# Patient Record
Sex: Male | Born: 1970 | Race: White | Hispanic: Yes | Marital: Married | Smoking: Never smoker
Health system: Southern US, Community
[De-identification: ages and names within clinical notes are randomized; demographics above are authoritative.]

## PROBLEM LIST (undated history)

## (undated) DIAGNOSIS — I10 Essential (primary) hypertension: Secondary | ICD-10-CM

## (undated) DIAGNOSIS — Z9889 Other specified postprocedural states: Secondary | ICD-10-CM

## (undated) DIAGNOSIS — D649 Anemia, unspecified: Secondary | ICD-10-CM

## (undated) DIAGNOSIS — F32A Depression, unspecified: Secondary | ICD-10-CM

## (undated) DIAGNOSIS — Z9689 Presence of other specified functional implants: Secondary | ICD-10-CM

## (undated) DIAGNOSIS — R7303 Prediabetes: Secondary | ICD-10-CM

## (undated) DIAGNOSIS — F419 Anxiety disorder, unspecified: Secondary | ICD-10-CM

## (undated) DIAGNOSIS — F329 Major depressive disorder, single episode, unspecified: Secondary | ICD-10-CM

## (undated) DIAGNOSIS — Z87898 Personal history of other specified conditions: Secondary | ICD-10-CM

## (undated) DIAGNOSIS — R569 Unspecified convulsions: Secondary | ICD-10-CM

## (undated) HISTORY — DX: Other specified postprocedural states: Z98.890

## (undated) HISTORY — DX: Presence of other specified functional implants: Z96.89

## (undated) HISTORY — PX: OTHER SURGICAL HISTORY: SHX169

## (undated) HISTORY — PX: BACK SURGERY: SHX140

## (undated) HISTORY — PX: SPINAL CORD STIMULATOR INSERTION: SHX5378

## (undated) HISTORY — DX: Personal history of other specified conditions: Z87.898

---
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
## 2008-02-18 DIAGNOSIS — F419 Anxiety disorder, unspecified: Secondary | ICD-10-CM | POA: Insufficient documentation

## 2008-03-02 DIAGNOSIS — F410 Panic disorder [episodic paroxysmal anxiety] without agoraphobia: Secondary | ICD-10-CM | POA: Insufficient documentation

## 2009-05-26 DIAGNOSIS — R519 Headache, unspecified: Secondary | ICD-10-CM | POA: Insufficient documentation

## 2013-12-07 DIAGNOSIS — Z981 Arthrodesis status: Secondary | ICD-10-CM | POA: Insufficient documentation

## 2013-12-30 DIAGNOSIS — E7800 Pure hypercholesterolemia, unspecified: Secondary | ICD-10-CM | POA: Insufficient documentation

## 2014-01-24 DIAGNOSIS — M549 Dorsalgia, unspecified: Secondary | ICD-10-CM | POA: Insufficient documentation

## 2014-02-04 DIAGNOSIS — M960 Pseudarthrosis after fusion or arthrodesis: Secondary | ICD-10-CM | POA: Insufficient documentation

## 2014-02-04 DIAGNOSIS — M25571 Pain in right ankle and joints of right foot: Secondary | ICD-10-CM | POA: Insufficient documentation

## 2014-12-01 DIAGNOSIS — G40A19 Absence epileptic syndrome, intractable, without status epilepticus: Secondary | ICD-10-CM | POA: Insufficient documentation

## 2015-09-19 DIAGNOSIS — M47812 Spondylosis without myelopathy or radiculopathy, cervical region: Secondary | ICD-10-CM | POA: Insufficient documentation

## 2015-09-19 DIAGNOSIS — M5412 Radiculopathy, cervical region: Secondary | ICD-10-CM | POA: Insufficient documentation

## 2015-09-19 DIAGNOSIS — M5416 Radiculopathy, lumbar region: Secondary | ICD-10-CM | POA: Insufficient documentation

## 2017-01-30 HISTORY — PX: FOOT SURGERY: SHX648

## 2018-01-30 HISTORY — PX: SHOULDER SURGERY: SHX246

## 2018-02-27 DIAGNOSIS — M25519 Pain in unspecified shoulder: Secondary | ICD-10-CM | POA: Insufficient documentation

## 2018-04-23 DIAGNOSIS — M7581 Other shoulder lesions, right shoulder: Secondary | ICD-10-CM | POA: Insufficient documentation

## 2018-10-11 ENCOUNTER — Ambulatory Visit (INDEPENDENT_AMBULATORY_CARE_PROVIDER_SITE_OTHER): Payer: Medicare Other | Admitting: Psychiatry

## 2018-10-11 ENCOUNTER — Encounter (HOSPITAL_COMMUNITY): Payer: Self-pay | Admitting: Psychiatry

## 2018-10-11 ENCOUNTER — Other Ambulatory Visit: Payer: Self-pay

## 2018-10-11 VITALS — Wt 180.0 lb

## 2018-10-11 DIAGNOSIS — F411 Generalized anxiety disorder: Secondary | ICD-10-CM

## 2018-10-11 DIAGNOSIS — F410 Panic disorder [episodic paroxysmal anxiety] without agoraphobia: Secondary | ICD-10-CM | POA: Diagnosis not present

## 2018-10-11 DIAGNOSIS — F319 Bipolar disorder, unspecified: Secondary | ICD-10-CM

## 2018-10-11 MED ORDER — VENLAFAXINE HCL 75 MG PO TABS: 75.0000 mg | ORAL_TABLET | ORAL | 1 refills | Status: DC

## 2018-10-11 MED ORDER — ESCITALOPRAM OXALATE 20 MG PO TABS: 20.0000 mg | ORAL_TABLET | Freq: Every day | ORAL | 1 refills | Status: DC

## 2018-10-11 MED ORDER — CLONAZEPAM 0.5 MG PO TABS: 0.5000 mg | ORAL_TABLET | Freq: Every day | ORAL | 1 refills | Status: DC | PRN

## 2018-10-11 MED ORDER — HYDROXYZINE PAMOATE 25 MG PO CAPS: 25.0000 mg | ORAL_CAPSULE | Freq: Four times a day (QID) | ORAL | 1 refills | Status: DC | PRN

## 2018-10-11 MED ORDER — QUETIAPINE FUMARATE 300 MG PO TABS: 300.0000 mg | ORAL_TABLET | Freq: Every day | ORAL | 1 refills | Status: DC
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
## 2018-10-17 ENCOUNTER — Ambulatory Visit: Payer: Medicare Other | Admitting: Podiatry

## 2018-10-29 ENCOUNTER — Other Ambulatory Visit: Payer: Self-pay

## 2018-10-29 ENCOUNTER — Ambulatory Visit (INDEPENDENT_AMBULATORY_CARE_PROVIDER_SITE_OTHER): Payer: Medicare Other

## 2018-10-29 ENCOUNTER — Ambulatory Visit (INDEPENDENT_AMBULATORY_CARE_PROVIDER_SITE_OTHER): Payer: Medicare Other | Admitting: Sports Medicine

## 2018-10-29 DIAGNOSIS — M79671 Pain in right foot: Secondary | ICD-10-CM

## 2018-10-29 DIAGNOSIS — M792 Neuralgia and neuritis, unspecified: Secondary | ICD-10-CM | POA: Diagnosis not present

## 2018-10-29 DIAGNOSIS — G9782 Other postprocedural complications and disorders of nervous system: Secondary | ICD-10-CM

## 2018-10-29 DIAGNOSIS — R200 Anesthesia of skin: Secondary | ICD-10-CM

## 2018-10-29 DIAGNOSIS — G90521 Complex regional pain syndrome I of right lower limb: Secondary | ICD-10-CM
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
## 2018-10-30 ENCOUNTER — Encounter (HOSPITAL_COMMUNITY): Payer: Self-pay | Admitting: Emergency Medicine

## 2018-10-30 ENCOUNTER — Other Ambulatory Visit: Payer: Self-pay

## 2018-10-30 ENCOUNTER — Encounter: Payer: Self-pay | Admitting: Sports Medicine

## 2018-10-30 ENCOUNTER — Telehealth: Payer: Self-pay | Admitting: *Deleted

## 2018-10-30 ENCOUNTER — Emergency Department (HOSPITAL_COMMUNITY)
Admission: EM | Admit: 2018-10-30 | Discharge: 2018-10-30 | Disposition: A | Discharge status: Home / Self Care | Payer: Medicare Other | Attending: Emergency Medicine | Admitting: Emergency Medicine

## 2018-10-30 DIAGNOSIS — M545 Low back pain: Secondary | ICD-10-CM | POA: Insufficient documentation

## 2018-10-30 DIAGNOSIS — M792 Neuralgia and neuritis, unspecified: Secondary | ICD-10-CM

## 2018-10-30 DIAGNOSIS — G8929 Other chronic pain: Secondary | ICD-10-CM | POA: Diagnosis not present

## 2018-10-30 DIAGNOSIS — I10 Essential (primary) hypertension: Secondary | ICD-10-CM | POA: Insufficient documentation

## 2018-10-30 DIAGNOSIS — Z79899 Other long term (current) drug therapy: Secondary | ICD-10-CM | POA: Diagnosis not present

## 2018-10-30 DIAGNOSIS — R200 Anesthesia of skin: Secondary | ICD-10-CM

## 2018-10-30 DIAGNOSIS — G90521 Complex regional pain syndrome I of right lower limb: Secondary | ICD-10-CM

## 2018-10-30 DIAGNOSIS — G9782 Other postprocedural complications and disorders of nervous system: Secondary | ICD-10-CM

## 2018-10-30 HISTORY — DX: Unspecified convulsions: R56.9

## 2018-10-30 HISTORY — DX: Depression, unspecified: F32.A

## 2018-10-30 HISTORY — DX: Essential (primary) hypertension: I10

## 2018-10-30 HISTORY — DX: Anxiety disorder, unspecified: F41.9

## 2018-10-30 MED ORDER — OXYCODONE-ACETAMINOPHEN 5-325 MG PO TABS: 1.0000 | ORAL_TABLET | Freq: Four times a day (QID) | ORAL | 0 refills | Status: DC | PRN

## 2018-10-30 MED ORDER — OXYCODONE-ACETAMINOPHEN 5-325 MG PO TABS
1.0000 | ORAL_TABLET | Freq: Once | ORAL | Status: AC
  Administered 2018-10-30: 10:00:00 1 via ORAL
  Filled 2018-10-30: qty 1

## 2018-10-30 NOTE — ED Provider Notes (Signed)
[facility] EMERGENCY DEPARTMENT Provider Note   CSN: [id number] Arrival date & time: 10/30/18  0932     History   Chief Complaint Chief Complaint  Patient presents with  . Back Pain    HPI Manuel Tucker is a 48 y.o. male.     Manuel Tucker is a 48 y.o. male with hx of chronic back pain s/p multiple back surgeries, who presents with back pain. Recently moved here from [facility], was being managed by pain management, but does not have a a pain doctor here yet, has upcoming appointment next week. Reports that he was washing his dog and bending over and had a sudden onset of 8/10 pain on Last saturday.  He denies any associated numbness weakness, no falls or trauma to the back.  He denies any saddle anesthesia or loss of bowel or bladder control.  No associated abdominal pain, urinary symptoms, fevers or vomiting.  Since then he has not been able to get pain under control with his usual medications, he typically uses Mobic, diclofenac gel, and cyclobenzaprine in addition to his nerve stimulator.  Patient recently moved here in August and has not yet had a follow-up appointment for pain management, but is working on getting an appointment to see Dr. Jesusita Oka who can also help manage his nerve stimulator as soon as possible.     Past Medical History:  Diagnosis Date  . Anxiety   . Depression   . H/O idiopathic seizure   . History of ankle surgery   . History of back surgery   . Hypertension   . S/P insertion of spinal cord stimulator   . Seizures ([place])     There are no active problems to display for this patient.   Past Surgical History:  Procedure Laterality Date  . BACK SURGERY          Home Medications    Prior to Admission medications   Medication Sig Start Date End Date Taking? Authorizing Provider  amLODipine (NORVASC) 10 MG tablet 10 mg. 10/02/18   [provider]  clonazePAM (KLONOPIN) 0.5 MG tablet Take 1 tablet (0.5 mg total) by mouth  daily as needed for anxiety. 10/11/18 10/11/19  Arfeen, Arlyce Harman, MD  cyclobenzaprine (FLEXERIL) 10 MG tablet daily as needed. 10/02/18   [provider]  escitalopram (LEXAPRO) 20 MG tablet Take 1 tablet (20 mg total) by mouth daily. 10/11/18 10/11/19  Arfeen, Arlyce Harman, MD  hydrOXYzine (VISTARIL) 25 MG capsule Take 1 capsule (25 mg total) by mouth 4 (four) times daily as needed for anxiety. 10/11/18   Arfeen, Arlyce Harman, MD  oxyCODONE-acetaminophen (PERCOCET) 5-325 MG tablet Take 1 tablet by mouth every 6 (six) hours as needed. 10/30/18   Jacqlyn Larsen, PA-C  QUEtiapine (SEROQUEL) 300 MG tablet Take 1 tablet (300 mg total) by mouth at bedtime. 10/11/18 10/11/19  Arfeen, Arlyce Harman, MD  simvastatin (ZOCOR) 20 MG tablet TK 1 T PO QD UNTIL DIRECTED TO STOP 10/02/18   [provider]  venlafaxine (EFFEXOR) 75 MG tablet Take 1 tablet (75 mg total) by mouth every morning. 10/11/18 10/11/19  Kathlee Nations, MD    Family History History reviewed. No pertinent family history.  Social History Social History   Tobacco Use  . Smoking status: Never Smoker  . Smokeless tobacco: Never Used  Substance Use Topics  . Alcohol use: Never    Frequency: Never  . Drug use: Never     Allergies   Patient has  no known allergies.   Review of Systems Review of Systems  Constitutional: Negative for chills and fever.  HENT: Negative.   Respiratory: Negative for shortness of breath.   Cardiovascular: Negative for chest pain.  Gastrointestinal: Negative for abdominal pain, constipation, diarrhea, nausea and vomiting.  Genitourinary: Negative for dysuria, flank pain, frequency and hematuria.  Musculoskeletal: Positive for back pain. Negative for arthralgias, gait problem, joint swelling, myalgias and neck pain.  Skin: Negative for color change, rash and wound.  Neurological: Negative for weakness and numbness.     Physical Exam Updated Vital Signs BP 131/85 (BP Location: Right Arm)   Pulse 89   Temp 97.9 F  (36.6 C) (Oral)   Resp 18   SpO2 99%   Physical Exam Vitals signs and nursing note reviewed.  Constitutional:      General: He is not in acute distress.    Appearance: He is well-developed. He is not diaphoretic.  HENT:     Head: Atraumatic.  Eyes:     General:        Right eye: No discharge.        Left eye: No discharge.  Neck:     Musculoskeletal: Neck supple.  Cardiovascular:     Pulses:          Radial pulses are 2+ on the right side and 2+ on the left side.       Dorsalis pedis pulses are 2+ on the right side and 2+ on the left side.       Posterior tibial pulses are 2+ on the right side and 2+ on the left side.  Pulmonary:     Effort: Pulmonary effort is normal. No respiratory distress.  Abdominal:     General: Bowel sounds are normal. There is no distension.     Palpations: Abdomen is soft. There is no mass.     Tenderness: There is no abdominal tenderness. There is no guarding.     Comments: Abdomen soft, nondistended, nontender to palpation in all quadrants without guarding or peritoneal signs, no CVA tenderness bilaterally  Musculoskeletal:     Comments: Tenderness to palpation over right low back musculature.  Pain made worse with range of motion of the lower extremities, neg straight leg raise, no sciatic sx  Skin:    General: Skin is warm and dry.     Capillary Refill: Capillary refill takes less than 2 seconds.  Neurological:     Mental Status: He is alert and oriented to person, place, and time.     Comments: Alert, clear speech, following commands. Moving all extremities without difficulty. Bilateral lower extremities with 5/5 strength in proximal and distal muscle groups and with dorsi and plantar flexion. Sensation intact in bilateral lower extremities. 2+ patellar DTRs bilaterally. Ambulatory with steady gait  Psychiatric:        Mood and Affect: Mood normal.        Behavior: Behavior normal.      ED Treatments / Results  Labs (all labs ordered  are listed, but only abnormal results are displayed) Labs Reviewed - No data to display  EKG None  Radiology No results found.  Procedures Procedures (including critical care time)  Medications Ordered in ED Medications  oxyCODONE-acetaminophen (PERCOCET/ROXICET) 5-325 MG per tablet 1 tablet (has no administration in time range)     Initial Impression / Assessment and Plan / ED Course  I have reviewed the triage vital signs and the nursing notes.  Pertinent labs & imaging  results that were available during my care of the patient were reviewed by me and considered in my medical decision making (see chart for details).  Patient with acute worsening of chronic low back pain.  Has had multiple surgeries previously, and has a nerve stimulator in place, recently moved here from [place] and has not yet found a spine specialist/pain medicine doctor for follow-up, but is working on make an appointment with Dr. Verlan FriendsSpivy.  No neurological deficits and normal neuro exam.  Patient can walk but states is painful.  No loss of bowel or bladder control.  No concern for cauda equina.  No fever, night sweats, weight loss, h/o cancer, IVDU.  Will discharge patient with short course of narcotic pain medication to help bring pain back down to baseline, until he is able to follow-up with pain management.  Narcotic database reviewed, no recent prescriptions.  Patient reports that he has not had to use narcotic pain medication much since he had his nerve stimulator placed.  Return precautions discussed.  Patient expresses understanding and agreement with plan.  Discharged home in good condition.  Final Clinical Impressions(s) / ED Diagnoses   Final diagnoses:  Chronic right-sided low back pain without sciatica    ED Discharge Orders         Ordered    oxyCODONE-acetaminophen (PERCOCET) 5-325 MG tablet  Every 6 hours PRN     10/30/18 1018           Jodi GeraldsFord, Clarke Peretz [place], [place]-C 10/30/18 1023    Tilden Fossaees,  Elizabeth, MD 10/31/18 [phone]

## 2018-10-30 NOTE — ED Triage Notes (Signed)
Hx of back pain, with stimulator -- back hurting on right lumbar area
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
## 2018-10-30 NOTE — Discharge Instructions (Addendum)
Continue with your usual medications for back pain, and use prescribed pain medication as needed for breakthrough pain, you can also use alternating ice and heat.  Follow-up with pain management doctor soon as possible as planned.  Return for any new or worsening symptoms, especially if you develop numbness weakness, or loss of bowel or bladder control.

## 2018-10-30 NOTE — Telephone Encounter (Signed)
Orders to [facility]. 

## 2018-10-31 ENCOUNTER — Other Ambulatory Visit: Payer: Self-pay

## 2018-10-31 DIAGNOSIS — R202 Paresthesia of skin: Secondary | ICD-10-CM

## 2018-11-13 ENCOUNTER — Ambulatory Visit (INDEPENDENT_AMBULATORY_CARE_PROVIDER_SITE_OTHER): Payer: Medicare Other | Admitting: Neurology

## 2018-11-13 ENCOUNTER — Other Ambulatory Visit: Payer: Self-pay

## 2018-11-13 ENCOUNTER — Telehealth: Payer: Self-pay | Admitting: *Deleted

## 2018-11-13 DIAGNOSIS — M792 Neuralgia and neuritis, unspecified: Secondary | ICD-10-CM

## 2018-11-13 DIAGNOSIS — R200 Anesthesia of skin: Secondary | ICD-10-CM

## 2018-11-13 DIAGNOSIS — G90521 Complex regional pain syndrome I of right lower limb: Secondary | ICD-10-CM

## 2018-11-13 DIAGNOSIS — G9782 Other postprocedural complications and disorders of nervous system: Secondary | ICD-10-CM

## 2018-11-13 NOTE — Telephone Encounter (Signed)
-----   Message from Landis Martins, [place] sent at 11/13/2018 11:46 AM EDT -----   ----- Message ----- From: Alda Berthold, DO Sent: 11/13/2018  10:58 AM EDT To: Landis Martins, DPM

## 2018-11-13 NOTE — Progress Notes (Signed)
Val, will you make sure patient has neurology follow up after his nerve studies which reveal decreased nerve function which is due to previous injury and surgeries. -Dr. Cannon Kettle

## 2018-11-13 NOTE — Telephone Encounter (Signed)
Ok great, didn't realize that Thanks Dr. Chauncey Cruel

## 2018-11-13 NOTE — Telephone Encounter (Signed)
Pt had appt today with Dr. Narda Amber.

## 2018-11-13 NOTE — Procedures (Signed)
[facility]  [place]  [place], [place] [address] Tel: [phone] Fax:  [phone] Test Date:  11/13/2018  Patient: Manuel Tucker DOB: March 15, 1970 Physician: Narda Amber, DO  Sex: Male Height: 5\' 8"  Ref Phys: Landis Martins, D.P.M.  ID#: [id number] Temp: 33.0C Technician:    Patient Complaints: This is a 48 year old man with history of multiple foot surgeries referred for evaluation of right foot pain and paresthesias.  NCV & EMG Findings: Extensive electrodiagnostic testing of the right lower extremity shows:  1. Right sural and superficial peroneal sensory responses are within normal limits. 2. Right peroneal motor response at the extensor digitorum brevis is absent, and normal at the tibialis anterior.  Right tibial motor response shows mildly reduced amplitude (7.0 mV).   3. There is no evidence of active or chronic motor axonal loss changes affecting any of the tested muscles.  Motor unit configuration and recruitment pattern is within normal limits.    Impression: There is no evidence of a sensorimotor polyneuropathy or lumbosacral radiculopathy affecting the right lower extremity.    Of note, reduced motor amplitudes isolated to the right foot in the absence of associated needle electrode abnormalities are most likely due to localized injury from prior surgeries.    ___________________________ Narda Amber, DO    Nerve Conduction Studies Anti Sensory Summary Table   Site NR Peak (ms) Norm Peak (ms) P-T Amp (V) Norm P-T Amp  Right Sup Peroneal Anti Sensory (Ant Lat Mall)  33C  12 cm    3.4 <4.5 6.7 >5  Right Sural Anti Sensory (Lat Mall)  33C  Calf    3.0 <4.5 6.9 >5   Motor Summary Table   Site NR Onset (ms) Norm Onset (ms) O-P Amp (mV) Norm O-P Amp Site1 Site2 Delta-0 (ms) Dist (cm) Vel (m/s) Norm Vel (m/s)  Right Peroneal Motor (Ext Dig Brev)  33C  Ankle NR  <5.5  >3 B Fib Ankle  0.0  >40  B Fib NR     Poplt B Fib  0.0   >40  Poplt NR            Right Peroneal TA Motor (Tib Ant)  33C  Fib Head    2.4 <4.0 7.1 >4 Poplit Fib Head 1.2 8.0 67 >40  Poplit    3.6  6.9         Right Tibial Motor (Abd Hall Brev)  33C  Ankle    5.0 <6.0 7.0 >8 Knee Ankle 8.3 41.0 49 >40  Knee    13.3  5.7          H Reflex Studies   NR H-Lat (ms) Lat Norm (ms) L-R H-Lat (ms)  Right Tibial (Gastroc)  33C     31.02 <35    EMG   Side Muscle Ins Act Fibs Psw Fasc Number Recrt Dur Dur. Amp Amp. Poly Poly. Comment  Right AntTibialis Nml Nml Nml Nml Nml Nml Nml Nml Nml Nml Nml Nml N/A  Right Gastroc Nml Nml Nml Nml Nml Nml Nml Nml Nml Nml Nml Nml N/A  Right Flex Dig Long Nml Nml Nml Nml Nml Nml Nml Nml Nml Nml Nml Nml N/A  Right AbdHallucis Nml Nml Nml Nml Nml Nml Nml Nml Nml Nml Nml Nml N/A  Right Ext Dig Brev Nml Nml Nml Nml Nml Nml Nml Nml Nml Nml Nml Nml N/A  Right RectFemoris Nml Nml Nml Nml Nml Nml Nml Nml Nml Nml Nml Nml N/A  Right GluteusMed Nml Nml Nml Nml Nml Nml Nml Nml Nml Nml Nml Nml N/A      Waveforms:

## 2018-11-22 ENCOUNTER — Other Ambulatory Visit: Payer: Self-pay

## 2018-11-22 ENCOUNTER — Ambulatory Visit (INDEPENDENT_AMBULATORY_CARE_PROVIDER_SITE_OTHER): Payer: Medicare Other | Admitting: Psychiatry

## 2018-11-22 ENCOUNTER — Encounter (HOSPITAL_COMMUNITY): Payer: Self-pay | Admitting: Psychiatry

## 2018-11-22 DIAGNOSIS — F410 Panic disorder [episodic paroxysmal anxiety] without agoraphobia: Secondary | ICD-10-CM | POA: Diagnosis not present

## 2018-11-22 DIAGNOSIS — F319 Bipolar disorder, unspecified: Secondary | ICD-10-CM

## 2018-11-22 DIAGNOSIS — F411 Generalized anxiety disorder: Secondary | ICD-10-CM

## 2018-11-22 MED ORDER — ESCITALOPRAM OXALATE 20 MG PO TABS: 20.0000 mg | ORAL_TABLET | Freq: Every day | ORAL | 1 refills | Status: DC

## 2018-11-22 MED ORDER — HYDROXYZINE PAMOATE 25 MG PO CAPS: 25.0000 mg | ORAL_CAPSULE | Freq: Four times a day (QID) | ORAL | 1 refills | Status: DC | PRN

## 2018-11-22 MED ORDER — QUETIAPINE FUMARATE 300 MG PO TABS: 300.0000 mg | ORAL_TABLET | Freq: Every day | ORAL | 1 refills | Status: DC

## 2018-11-22 MED ORDER — CLONAZEPAM 0.5 MG PO TABS: 0.5000 mg | ORAL_TABLET | Freq: Every day | ORAL | 1 refills | Status: DC | PRN
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
## 2018-12-03 ENCOUNTER — Other Ambulatory Visit: Payer: Self-pay | Admitting: Sports Medicine

## 2018-12-03 DIAGNOSIS — M792 Neuralgia and neuritis, unspecified: Secondary | ICD-10-CM

## 2018-12-05 ENCOUNTER — Other Ambulatory Visit (HOSPITAL_COMMUNITY): Payer: Self-pay | Admitting: Pain Medicine

## 2018-12-06 ENCOUNTER — Other Ambulatory Visit (HOSPITAL_COMMUNITY): Payer: Self-pay | Admitting: Pain Medicine

## 2018-12-06 DIAGNOSIS — M545 Low back pain, unspecified: Secondary | ICD-10-CM

## 2018-12-09 ENCOUNTER — Ambulatory Visit (HOSPITAL_COMMUNITY): Payer: Self-pay

## 2018-12-09 ENCOUNTER — Encounter (HOSPITAL_COMMUNITY): Payer: Self-pay

## 2018-12-10 ENCOUNTER — Encounter: Payer: Medicare Other | Admitting: Neurology

## 2018-12-13 ENCOUNTER — Other Ambulatory Visit (HOSPITAL_COMMUNITY): Payer: Self-pay

## 2018-12-13 DIAGNOSIS — F411 Generalized anxiety disorder: Secondary | ICD-10-CM

## 2018-12-13 MED ORDER — VENLAFAXINE HCL 75 MG PO TABS: 75.0000 mg | ORAL_TABLET | ORAL | 0 refills | Status: DC

## 2018-12-16 ENCOUNTER — Encounter: Payer: Self-pay | Admitting: Neurology

## 2018-12-17 ENCOUNTER — Other Ambulatory Visit: Payer: Self-pay

## 2018-12-17 ENCOUNTER — Ambulatory Visit (HOSPITAL_COMMUNITY)
Admission: RE | Admit: 2018-12-17 | Discharge: 2018-12-17 | Disposition: A | Discharge status: Home / Self Care | Payer: Medicare Other | Source: Ambulatory Visit | Attending: Pain Medicine | Admitting: Pain Medicine

## 2018-12-17 DIAGNOSIS — M545 Low back pain, unspecified: Secondary | ICD-10-CM

## 2019-01-13 ENCOUNTER — Encounter (HOSPITAL_COMMUNITY): Payer: Self-pay | Admitting: Psychiatry

## 2019-01-13 ENCOUNTER — Other Ambulatory Visit: Payer: Self-pay

## 2019-01-13 ENCOUNTER — Ambulatory Visit (INDEPENDENT_AMBULATORY_CARE_PROVIDER_SITE_OTHER): Payer: Medicare Other | Admitting: Psychiatry

## 2019-01-13 DIAGNOSIS — F410 Panic disorder [episodic paroxysmal anxiety] without agoraphobia: Secondary | ICD-10-CM | POA: Diagnosis not present

## 2019-01-13 DIAGNOSIS — F411 Generalized anxiety disorder: Secondary | ICD-10-CM | POA: Diagnosis not present

## 2019-01-13 DIAGNOSIS — F319 Bipolar disorder, unspecified: Secondary | ICD-10-CM | POA: Diagnosis not present

## 2019-01-13 MED ORDER — VENLAFAXINE HCL 75 MG PO TABS: 75.0000 mg | ORAL_TABLET | ORAL | 2 refills | Status: DC

## 2019-01-13 MED ORDER — QUETIAPINE FUMARATE 300 MG PO TABS: 300.0000 mg | ORAL_TABLET | Freq: Every day | ORAL | 2 refills | Status: DC

## 2019-01-13 MED ORDER — HYDROXYZINE PAMOATE 25 MG PO CAPS: 25.0000 mg | ORAL_CAPSULE | Freq: Four times a day (QID) | ORAL | 2 refills | Status: DC | PRN

## 2019-01-13 MED ORDER — ESCITALOPRAM OXALATE 20 MG PO TABS: 20.0000 mg | ORAL_TABLET | Freq: Every day | ORAL | 2 refills | Status: DC

## 2019-01-13 MED ORDER — CLONAZEPAM 0.5 MG PO TABS: 0.5000 mg | ORAL_TABLET | Freq: Every day | ORAL | 1 refills | Status: DC | PRN
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
## 2019-01-20 ENCOUNTER — Ambulatory Visit
Admission: RE | Admit: 2019-01-20 | Discharge: 2019-01-20 | Disposition: A | Discharge status: Home / Self Care | Payer: Medicare Other | Source: Ambulatory Visit

## 2019-01-20 DIAGNOSIS — H1033 Unspecified acute conjunctivitis, bilateral: Secondary | ICD-10-CM

## 2019-01-20 MED ORDER — OLOPATADINE HCL 0.1 % OP SOLN: 1.0000 [drp] | Freq: Two times a day (BID) | OPHTHALMIC | 0 refills | Status: DC

## 2019-01-20 MED ORDER — POLYMYXIN B-TRIMETHOPRIM 10000-0.1 UNIT/ML-% OP SOLN: 1.0000 [drp] | OPHTHALMIC | 0 refills | Status: DC

## 2019-01-20 NOTE — ED Provider Notes (Addendum)
Virtual Visit via Video Note:  Manuel Tucker  initiated request for Telemedicine visit with [facility] team. I connected with Manuel Tucker  on 01/20/2019 at 9:57 AM  for a synchronized telemedicine visit using a video enabled HIPPA compliant telemedicine application. I verified that I am speaking with Manuel Tucker  using two identifiers. Manuel Jennette C Darryl Blumenstein, PA-C  was physically located in a [facility] Urgent care site and Manuel Tucker was located at a different location.   The limitations of evaluation and management by telemedicine as well as the availability of in-person appointments were discussed. Patient was informed that he  may incur a bill ( including co-pay) for this virtual visit encounter. Manuel Tucker  expressed understanding and gave verbal consent to proceed with virtual visit.     History of Present Illness:Manuel Tucker  is a 48 y.o. male presents for evaluation of possible pinkeye.  Patient states that over the past 5 days his eyes have felt itchy, red with occasional discharge.  He denies any associated pain, but has had a foreign body sensation.  Notes some mild blurring in the morning when he first wakes up, but states that this improves and is not persistent throughout the day after rubbing his eyes.  His wife is at similar symptoms.  He denies contact use. Denies vision changes. Denies associated URI symptoms of ear pain or nasal congestion.  He has been using leftover antibiotic drops from prior conjunctivitis treatments.  His symptoms are bilateral.   No Known Allergies   Past Medical History:  Diagnosis Date  . Anxiety   . Depression   . H/O idiopathic seizure   . History of ankle surgery   . History of back surgery   . Hypertension   . S/P insertion of spinal cord stimulator   . Seizures ([place])      Social History   Tobacco Use  . Smoking status: Never Smoker  . Smokeless tobacco: Never Used  Substance Use Topics  . Alcohol use:  Never  . Drug use: Never        Observations/Objective: Physical Exam  Constitutional: He is oriented to person, place, and time and well-developed, well-nourished, and in no distress. No distress.  HENT:  Head: Normocephalic and atraumatic.  Eyes: Pupils are equal, round, and reactive to light. EOM are normal.  Moving all eyes appropriately, conjunctive appears white, but limited due to quality of video; no swelling noted to periorbital area  Pulmonary/Chest: Effort normal. No respiratory distress.  Speaking in full sentences  Musculoskeletal:     Cervical back: Normal range of motion.  Neurological: He is alert and oriented to person, place, and time.  Speech clear, face symmetric     Assessment and Plan:    ICD-10-CM   1. Acute conjunctivitis of both eyes, unspecified acute conjunctivitis type  H10.33     Discussed with patient likely viral versus allergic conjunctivitis.  Discussed symptomatic and supportive care, given patient has been taking antibiotic drops will go ahead and refill Polytrim to complete full course, also provided olopatadine to help with itching.  Discussed hygiene, avoiding touching eyes as well as compresses.  No red flags for eye emergency.  Discussed warning signs,Discussed strict return precautions. Patient verbalized understanding and is agreeable with plan.    Follow Up Instructions:     I discussed the assessment and treatment plan with the patient. The patient was provided an opportunity to ask questions and all were answered. The patient agreed with  the plan and demonstrated an understanding of the instructions.   The patient was advised to call back or seek an in-person evaluation if the symptoms worsen or if the condition fails to improve as anticipated.      Lew Dawes, PA-C  01/20/2019 9:57 AM         Lew Dawes, PA-C 01/20/19 0958    Lew Dawes, PA-C 01/20/19 [phone]

## 2019-01-20 NOTE — Discharge Instructions (Signed)
Please continue antibiotic eye drops every 4-6 hours Olopatadine twice daily  Follow up if not getting better or worsening

## 2019-01-31 HISTORY — PX: BICEPS TENDON REPAIR: SHX566

## 2019-02-05 ENCOUNTER — Other Ambulatory Visit: Payer: Medicare Other

## 2019-02-10 ENCOUNTER — Other Ambulatory Visit: Payer: Medicare Other

## 2019-02-14 ENCOUNTER — Telehealth (HOSPITAL_COMMUNITY): Payer: Self-pay | Admitting: *Deleted

## 2019-02-14 NOTE — Telephone Encounter (Signed)
Pt left message asking for a letter granting him an the use emotional support dog. Pt states that his previous MD endorsed this. Pt has an upcoming appointment on 04/09/19. Pt has h/o panic attacks. Please review.

## 2019-02-17 NOTE — Telephone Encounter (Signed)
Yes. He can have letter grantinh him for emotional support dog.

## 2019-02-18 ENCOUNTER — Encounter: Payer: Self-pay | Admitting: Neurology

## 2019-02-18 ENCOUNTER — Other Ambulatory Visit: Payer: Self-pay

## 2019-02-18 ENCOUNTER — Ambulatory Visit (INDEPENDENT_AMBULATORY_CARE_PROVIDER_SITE_OTHER): Payer: Medicare Other | Admitting: Neurology

## 2019-02-18 ENCOUNTER — Telehealth: Payer: Self-pay

## 2019-02-18 VITALS — BP 152/90 | HR 84 | Ht 68.0 in | Wt 205.0 lb

## 2019-02-18 DIAGNOSIS — G40009 Localization-related (focal) (partial) idiopathic epilepsy and epileptic syndromes with seizures of localized onset, not intractable, without status epilepticus: Secondary | ICD-10-CM

## 2019-02-18 MED ORDER — DIVALPROEX SODIUM 500 MG PO DR TAB: 500.0000 mg | DELAYED_RELEASE_TABLET | Freq: Two times a day (BID) | ORAL | 6 refills | Status: DC
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
## 2019-02-18 NOTE — Telephone Encounter (Signed)
Per Dr. Karel Jarvis records requested from Dr. Newman Nip in [facility].   Fax [phone]

## 2019-02-18 NOTE — Telephone Encounter (Signed)
Left message for Manuel Tucker to return call if needed. Pts name and MRN given. Pt has a spinal cord stimulator, therefore pt needs to have MRI of the brain at [facility]. Pt is aware of this.

## 2019-02-19 ENCOUNTER — Encounter (HOSPITAL_COMMUNITY): Payer: Self-pay

## 2019-02-20 ENCOUNTER — Other Ambulatory Visit: Payer: Self-pay

## 2019-02-20 ENCOUNTER — Ambulatory Visit (INDEPENDENT_AMBULATORY_CARE_PROVIDER_SITE_OTHER): Payer: Medicare Other | Admitting: Neurology

## 2019-02-20 DIAGNOSIS — G40009 Localization-related (focal) (partial) idiopathic epilepsy and epileptic syndromes with seizures of localized onset, not intractable, without status epilepticus: Secondary | ICD-10-CM

## 2019-02-24 ENCOUNTER — Telehealth: Payer: Self-pay | Admitting: Neurology

## 2019-02-24 NOTE — Telephone Encounter (Signed)
Rec'd records from [facility] forward 8 pages to Dr Patrcia Dolly

## 2019-02-25 ENCOUNTER — Telehealth: Payer: Self-pay

## 2019-02-25 MED ORDER — DIVALPROEX SODIUM 500 MG PO DR TAB: 1000.0000 mg | DELAYED_RELEASE_TABLET | Freq: Two times a day (BID) | ORAL | 6 refills | Status: DC

## 2019-02-25 NOTE — Procedures (Signed)
ELECTROENCEPHALOGRAM REPORT  Date of Study: 02/20/2019  Patient's Name: Manuel Tucker MRN: [id number] Date of Birth: [birth year]  Referring Provider: Dr. Patrcia Dolly  Clinical History: This is a 49 year old man with recurrent episodes of confusion, staring/unresponsiveness. EEG for classification.  Medications: DEPAKOTE NORVASC 10 MG tablet KLONOPIN 0.5 MG tablet FLEXERIL 10 MG tablet LEXAPRO 20 MG tablet VISTARIL 25 MG capsule PERCOCET 5-325 MG tablet oxyCODONE-acetaminophen  SEROQUEL 300 MG tablet ZOCOR 20 MG tablet EFFEXOR 75 MG tablet   Technical Summary: A multichannel digital 1-hour EEG recording measured by the international 10-20 system with electrodes applied with paste and impedances below 5000 ohms performed in our laboratory with EKG monitoring in an awake and asleep patient.  Hyperventilation was not performed. Photic stimulation was performed.  The digital EEG was referentially recorded, reformatted, and digitally filtered in a variety of bipolar and referential montages for optimal display.    Description: The patient is awake and asleep during the recording.  During maximal wakefulness, there is a symmetric, medium voltage 11-12 Hz posterior dominant rhythm that attenuates with eye opening.  The record is symmetric.  During drowsiness and sleep, there is an increase in theta slowing of the background.  Vertex waves and symmetric sleep spindles were seen.  Photic stimulation did not elicit any abnormalities.  There were no epileptiform discharges or electrographic seizures seen.    EKG lead was unremarkable.  Impression: This 1-hour awake and asleep EEG is normal.    Clinical Correlation: A normal EEG does not exclude a clinical diagnosis of epilepsy.  If further clinical questions remain, prolonged EEG may be helpful.  Clinical correlation is advised.   Patrcia Dolly, M.D.

## 2019-02-25 NOTE — Telephone Encounter (Signed)
EEG was normal, however it is not like a pregnancy test that is positive or negative. We talked about increasing the Depakote back to 1000mg  twice a day after the test. Pls let him know to increase Depakote 500mg : take 2 tabs BID.

## 2019-03-03 DIAGNOSIS — S46219A Strain of muscle, fascia and tendon of other parts of biceps, unspecified arm, initial encounter: Secondary | ICD-10-CM | POA: Insufficient documentation

## 2019-03-20 ENCOUNTER — Other Ambulatory Visit: Payer: Medicare Other

## 2019-03-25 ENCOUNTER — Other Ambulatory Visit: Payer: Self-pay

## 2019-03-25 ENCOUNTER — Ambulatory Visit
Admission: RE | Admit: 2019-03-25 | Discharge: 2019-03-25 | Disposition: A | Discharge status: Home / Self Care | Payer: Medicare Other | Source: Ambulatory Visit | Attending: Neurology | Admitting: Neurology

## 2019-03-25 DIAGNOSIS — G40009 Localization-related (focal) (partial) idiopathic epilepsy and epileptic syndromes with seizures of localized onset, not intractable, without status epilepticus: Secondary | ICD-10-CM

## 2019-03-25 MED ORDER — GADOBENATE DIMEGLUMINE 529 MG/ML IV SOLN
19.0000 mL | Freq: Once | INTRAVENOUS | Status: AC | PRN
  Administered 2019-03-25: 19 mL via INTRAVENOUS

## 2019-03-26 ENCOUNTER — Telehealth: Payer: Self-pay | Admitting: Neurology

## 2019-03-26 NOTE — Telephone Encounter (Signed)
Pt called and informed that MRI brain looked fine, no evidence of tumor, stroke, or bleed. It showed very minor hardening of the small blood vessels in the brain seen in patients with blood pressure issues. Very important to continue to control BP, cholesterol, sugar levels. Pt has an appointment with PCP for BP medication management, cholesterol is under control now per pt and he stated no blood sugar problems

## 2019-03-26 NOTE — Telephone Encounter (Signed)
Pls let him know MRI brain looked fine, no evidence of tumor, stroke, or bleed. It showed very minor hardening of the small blood vessels in the brain seen in patients with blood pressure issues. Very important to continue to control BP, cholesterol, sugar levels. Thanks

## 2019-03-26 NOTE — Telephone Encounter (Signed)
Patient would like the results of the MRI please call  

## 2019-04-09 ENCOUNTER — Other Ambulatory Visit: Payer: Self-pay

## 2019-04-09 ENCOUNTER — Encounter (HOSPITAL_COMMUNITY): Payer: Self-pay | Admitting: Psychiatry

## 2019-04-09 ENCOUNTER — Ambulatory Visit (INDEPENDENT_AMBULATORY_CARE_PROVIDER_SITE_OTHER): Payer: Medicare Other | Admitting: Psychiatry

## 2019-04-09 DIAGNOSIS — F410 Panic disorder [episodic paroxysmal anxiety] without agoraphobia: Secondary | ICD-10-CM

## 2019-04-09 DIAGNOSIS — F319 Bipolar disorder, unspecified: Secondary | ICD-10-CM

## 2019-04-09 DIAGNOSIS — F411 Generalized anxiety disorder: Secondary | ICD-10-CM | POA: Diagnosis not present

## 2019-04-09 MED ORDER — HYDROXYZINE PAMOATE 25 MG PO CAPS: 25.0000 mg | ORAL_CAPSULE | Freq: Four times a day (QID) | ORAL | 0 refills | Status: DC | PRN

## 2019-04-09 MED ORDER — VENLAFAXINE HCL 75 MG PO TABS: 75.0000 mg | ORAL_TABLET | Freq: Two times a day (BID) | ORAL | 1 refills | Status: DC

## 2019-04-09 MED ORDER — QUETIAPINE FUMARATE 300 MG PO TABS: 300.0000 mg | ORAL_TABLET | Freq: Every day | ORAL | 1 refills | Status: DC

## 2019-04-09 MED ORDER — ESCITALOPRAM OXALATE 20 MG PO TABS: 20.0000 mg | ORAL_TABLET | Freq: Every day | ORAL | 1 refills | Status: DC
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
## 2019-04-12 ENCOUNTER — Other Ambulatory Visit: Payer: Medicare Other

## 2019-04-25 ENCOUNTER — Emergency Department (HOSPITAL_COMMUNITY)
Admission: EM | Admit: 2019-04-25 | Discharge: 2019-04-25 | Disposition: A | Discharge status: Home / Self Care | Payer: Medicare Other | Attending: Emergency Medicine | Admitting: Emergency Medicine

## 2019-04-25 ENCOUNTER — Emergency Department (HOSPITAL_COMMUNITY): Payer: Medicare Other

## 2019-04-25 ENCOUNTER — Other Ambulatory Visit: Payer: Self-pay

## 2019-04-25 ENCOUNTER — Encounter (HOSPITAL_COMMUNITY): Payer: Self-pay | Admitting: Pediatrics

## 2019-04-25 DIAGNOSIS — G8929 Other chronic pain: Secondary | ICD-10-CM | POA: Diagnosis not present

## 2019-04-25 DIAGNOSIS — Y999 Unspecified external cause status: Secondary | ICD-10-CM | POA: Insufficient documentation

## 2019-04-25 DIAGNOSIS — I10 Essential (primary) hypertension: Secondary | ICD-10-CM | POA: Insufficient documentation

## 2019-04-25 DIAGNOSIS — Z79899 Other long term (current) drug therapy: Secondary | ICD-10-CM | POA: Insufficient documentation

## 2019-04-25 DIAGNOSIS — Z8669 Personal history of other diseases of the nervous system and sense organs: Secondary | ICD-10-CM | POA: Diagnosis not present

## 2019-04-25 DIAGNOSIS — S93401A Sprain of unspecified ligament of right ankle, initial encounter: Secondary | ICD-10-CM | POA: Diagnosis not present

## 2019-04-25 DIAGNOSIS — Y929 Unspecified place or not applicable: Secondary | ICD-10-CM | POA: Insufficient documentation

## 2019-04-25 DIAGNOSIS — X500XXA Overexertion from strenuous movement or load, initial encounter: Secondary | ICD-10-CM | POA: Diagnosis not present

## 2019-04-25 DIAGNOSIS — S99911A Unspecified injury of right ankle, initial encounter: Secondary | ICD-10-CM | POA: Diagnosis present

## 2019-04-25 DIAGNOSIS — Y9301 Activity, walking, marching and hiking: Secondary | ICD-10-CM | POA: Diagnosis not present

## 2019-04-25 DIAGNOSIS — M25571 Pain in right ankle and joints of right foot: Secondary | ICD-10-CM

## 2019-04-25 MED ORDER — IBUPROFEN 600 MG PO TABS: 600.0000 mg | ORAL_TABLET | Freq: Four times a day (QID) | ORAL | 0 refills | Status: DC | PRN

## 2019-04-25 MED ORDER — ACETAMINOPHEN 500 MG PO TABS: 500.0000 mg | ORAL_TABLET | Freq: Four times a day (QID) | ORAL | 0 refills | Status: AC | PRN

## 2019-04-25 NOTE — ED Triage Notes (Signed)
C/o right foot pain; patient reported twisted ankle while walking today. Stated he has had multiple surgeries on foot as well.

## 2019-04-25 NOTE — ED Notes (Signed)
Patient verbalizes understanding of discharge instructions. Opportunity for questioning and answers were provided. Pt discharged from ED. 

## 2019-04-25 NOTE — ED Provider Notes (Signed)
[facility] EMERGENCY DEPARTMENT Provider Note   CSN: [id number] Arrival date & time: 04/25/19  1021     History Chief Complaint  Patient presents with  . Ankle Pain    Arn Mcomber is a 49 y.o. male with history of hypertension, anxiety, depression presents for evaluation of acute onset, persistent pain to the right lateral malleolus secondary to injury at around 9 AM this morning.  He reports that he had just dropped his daughter off at school and was walking home when in an attempt to avoid stepping into a puddle he accidentally inverted his right ankle.  He has a history of prior ankle surgeries years ago in [place].  He has seen a podiatrist here at the end of last year but has not yet followed up.  He reports a constant pain to the lateral malleolus.  Pain does not radiate.  It worsens with attempts to bear weight.  He denies numbness or weakness of the extremity.  He has not tried anything for his symptoms.  No fevers. The history is provided by the patient.       Past Medical History:  Diagnosis Date  . Anxiety   . Depression   . H/O idiopathic seizure   . History of ankle surgery   . History of back surgery   . Hypertension   . S/P insertion of spinal cord stimulator   . Seizures [facility])     Patient Active Problem List   Diagnosis Date Noted  . Tendinitis of right rotator cuff 04/23/2018  . Shoulder pain 02/27/2018    Past Surgical History:  Procedure Laterality Date  . BACK SURGERY    . FOOT SURGERY Right 2019  . SHOULDER SURGERY Right 2020       No family history on file.  Social History   Tobacco Use  . Smoking status: Never Smoker  . Smokeless tobacco: Never Used  Substance Use Topics  . Alcohol use: Never  . Drug use: Never    Home Medications Prior to Admission medications   Medication Sig Start Date End Date Taking? Authorizing Provider  amLODipine (NORVASC) 10 MG tablet 10 mg. 10/02/18   [provider]    clonazePAM (KLONOPIN) 0.5 MG tablet Take 1 tablet (0.5 mg total) by mouth daily as needed for anxiety. Patient not taking: Reported on 04/09/2019 01/13/19 01/13/20  Arfeen, Phillips Grout, MD  cyclobenzaprine (FLEXERIL) 10 MG tablet daily as needed. 10/02/18   [provider]  divalproex (DEPAKOTE) 500 MG DR tablet Take 2 tablets (1,000 mg total) by mouth 2 (two) times daily. 02/25/19   Van Clines, MD  escitalopram (LEXAPRO) 20 MG tablet Take 1 tablet (20 mg total) by mouth daily. 04/09/19 04/08/20  Arfeen, Phillips Grout, MD  hydrOXYzine (VISTARIL) 25 MG capsule Take 1 capsule (25 mg total) by mouth 4 (four) times daily as needed for anxiety. Patient not taking: Reported on 04/09/2019 04/09/19   Arfeen, Phillips Grout, MD  oxyCODONE-acetaminophen (PERCOCET) 5-325 MG tablet Take 1 tablet by mouth every 6 (six) hours as needed. Patient not taking: Reported on 02/18/2019 10/30/18   Dartha Lodge, PA-C  QUEtiapine (SEROQUEL) 300 MG tablet Take 1 tablet (300 mg total) by mouth at bedtime. 04/09/19 04/08/20  Arfeen, Phillips Grout, MD  simvastatin (ZOCOR) 20 MG tablet TK 1 T PO QD UNTIL DIRECTED TO STOP 10/02/18   [provider]  venlafaxine (EFFEXOR) 75 MG tablet Take 1 tablet (75 mg total) by mouth 2 (two) times  daily with a meal. 04/09/19 04/08/20  Arfeen, Arlyce Harman, MD    Allergies    Patient has no known allergies.  Review of Systems   Review of Systems  Constitutional: Negative for fever.  Musculoskeletal: Positive for arthralgias and joint swelling.  Neurological: Negative for weakness and numbness.    Physical Exam Updated Vital Signs BP 133/82   Pulse 81   Temp 98.8 F (37.1 C) (Oral)   Resp 16   Ht 5\' 8"  (1.727 m)   Wt 92.1 kg   SpO2 99%   BMI 30.87 kg/m   Physical Exam Vitals and nursing note reviewed.  Constitutional:      General: He is not in acute distress.    Appearance: He is well-developed.  HENT:     Head: Normocephalic and atraumatic.  Eyes:     General:        Right eye: No  discharge.        Left eye: No discharge.     Conjunctiva/sclera: Conjunctivae normal.  Neck:     Vascular: No JVD.     Trachea: No tracheal deviation.  Cardiovascular:     Rate and Rhythm: Normal rate.     Pulses: Normal pulses.     Comments: 2+ DP/PT pulses bilaterally.  Compartments are soft, Bevelyn Buckles' sign absent bilaterally.  No palpable cords. Pulmonary:     Effort: Pulmonary effort is normal.  Abdominal:     General: There is no distension.  Musculoskeletal:        General: Tenderness present.     Comments: Several well-healed surgical scars to the right lower extremity.  He has limited range of motion at the left ankle at baseline but is able to flex, extend, invert and evert against resistance without difficulty.  There is focal tenderness to palpation along the lateral malleolus.  Examination of the Achilles tendon is within normal limits.  Skin:    General: Skin is warm and dry.     Findings: No erythema.  Neurological:     Mental Status: He is alert.     Comments: Sensation intact to light touch of bilateral lower extremities.  Psychiatric:        Behavior: Behavior normal.     ED Results / Procedures / Treatments   Labs (all labs ordered are listed, but only abnormal results are displayed) Labs Reviewed - No data to display  EKG None  Radiology DG Ankle Complete Right  Result Date: 04/25/2019 CLINICAL DATA:  Pain following twisting injury EXAM: RIGHT ANKLE - COMPLETE 3+ VIEW COMPARISON:  None. FINDINGS: Frontal, oblique, and lateral views were obtained. There is extensive postoperative change with screw and plate fixation in the distal tibia. Ankle mortise ankylosis noted. There are multiple screws throughout the calcaneus and talus with calcaneocuboid screw and plate fixation. There is ankylosis of all talar navicular joints. There is a prominent spur along the posterior dorsal navicular. There is distal tibia fibular syndesmosis. There is evidence of lucency in the  distal tibial diaphysis consistent with age uncertain incomplete fracture. Re-injury in this area is possible. Alignment is anatomic in this area. No other findings suggesting potential acute fracture. No evident joint effusion. IMPRESSION: 1. Extensive arthropathy with extensive postoperative change. Support hardware intact. 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 No bony destruction. 2. Obliquely oriented lucency in the distal tibial diaphysis may represent acute nondisplaced incomplete fracture in this area. Appearance  of the lucency in this area must be considered age uncertain. No other findings suggesting potential acute fracture. No joint effusion. Electronically Signed   By: Bretta Bang III M.D.   On: 04/25/2019 11:09   DG Foot Complete Right  Result Date: 04/25/2019 CLINICAL DATA:  Pain following twisting injury EXAM: RIGHT FOOT COMPLETE - 3+ VIEW COMPARISON:  October 29, 2018 FINDINGS: Frontal, oblique, and lateral views obtained. There is extensive postoperative change with extensive fusion of the ankle mortise as well as all subtalar joints. There is surgical screw and plate fixation in the calcaneocuboid region. Postoperative change noted in the distal tibia. Spurring along the dorsal navicular is stable in appearance. In the foot region, there is no evident acute fracture or dislocation. Mild narrowing in the first MTP joint is stable. Other more distal joints appear unremarkable and stable. No erosive change or bony destruction. IMPRESSION: No acute fracture or dislocation in the foot region. Extensive postoperative changes stable. Narrowing of the first MTP joint is stable. Electronically Signed   By: Bretta Bang III M.D.   On: 04/25/2019 11:11    Procedures Procedures (including critical care time)  Medications Ordered in ED Medications - No data to display  ED Course  I have reviewed the  triage vital signs and the nursing notes.  Pertinent labs & imaging results that were available during my care of the patient were reviewed by me and considered in my medical decision making (see chart for details).    MDM Rules/Calculators/A&P                       Patient presenting for evaluation of acute onset lateral right ankle pain secondary to injury earlier today.  He is afebrile, vital signs are stable.  He is nontoxic in appearance and is neurovascularly intact.  Compartments are soft.  No ligamentous laxity noted on examination.  He has a history of prior foot and ankle surgery years ago in [place] and is concerned that his hardware is broken or displaced.  Radiographs show no acute osseous abnormality.  His hardware appears intact on imaging.  He does have an obliquely oriented lucency in the distal tibial diaphysis which could potentially represent an acute nondisplaced incomplete fracture however he has no tenderness to palpation in this region and his pain is purely along the fibular region.  I suspect that he has a mild ankle sprain.  No sign of secondary skin infection and doubt osteomyelitis, septic arthritis, gout, or DVT.  CONSULT: Spoke with Charma Igo with orthopedics who reviewed the patient's images independently and agrees.  We will place the patient in an ASO ankle brace and give crutches.  Recommend follow-up with podiatry or orthopedics on an outpatient basis for reevaluation of symptoms.  Discussed strict ED return precautions. Patient verbalized understanding of and agreement with plan and is safe for discharge home at this time.   Final Clinical Impression(s) / ED Diagnoses Final diagnoses:  Sprain of right ankle, unspecified ligament, initial encounter  Acute right ankle pain    Rx / DC Orders ED Discharge Orders    None       Bennye Alm 04/25/19 1215    Tilden Fossa, MD 04/25/19 1553

## 2019-04-25 NOTE — Discharge Instructions (Signed)
Your x-rays showed an irregularity in the bone but this does not appear to be an acute fracture.  It has likely been present for a long time.  I had our orthopedist on-call reviewed the images and he agrees that this likely does not represent a fracture.  I suspect that you have an ankle sprain.  1. Medications: Alternate 600 mg of ibuprofen and [phone] mg of Tylenol every 3 hours as needed for pain. Do not exceed 4000 mg of Tylenol daily.  Take ibuprofen with food to avoid upset stomach issues.  2. Treatment: rest, ice 20 minutes at a time a few times daily, elevate when you are not walking on the extremity, and use brace and crutches to help you get around, drink plenty of fluids, gentle stretching 3. Follow Up: Please followup with orthopedics or your podiatrist as directed within 1 week if no improvement for discussion of your diagnoses and further evaluation after today's visit; if you do not have a primary care doctor use the resource guide provided to find one; Please return to the ER for worsening symptoms or other concerns such as worsening swelling, redness of the skin, fevers, loss of pulses, or loss of feeling

## 2019-05-16 ENCOUNTER — Other Ambulatory Visit (HOSPITAL_COMMUNITY): Payer: Self-pay | Admitting: *Deleted

## 2019-05-16 DIAGNOSIS — F411 Generalized anxiety disorder: Secondary | ICD-10-CM

## 2019-05-16 MED ORDER — HYDROXYZINE PAMOATE 25 MG PO CAPS: 25.0000 mg | ORAL_CAPSULE | Freq: Four times a day (QID) | ORAL | 0 refills | Status: DC | PRN

## 2019-05-19 ENCOUNTER — Other Ambulatory Visit (HOSPITAL_COMMUNITY): Payer: Self-pay

## 2019-05-19 DIAGNOSIS — F411 Generalized anxiety disorder: Secondary | ICD-10-CM

## 2019-05-19 MED ORDER — VENLAFAXINE HCL 75 MG PO TABS: 75.0000 mg | ORAL_TABLET | Freq: Two times a day (BID) | ORAL | 0 refills | Status: DC

## 2019-06-09 ENCOUNTER — Ambulatory Visit (HOSPITAL_COMMUNITY): Payer: Medicare Other | Admitting: Psychiatry

## 2019-06-10 ENCOUNTER — Telehealth (INDEPENDENT_AMBULATORY_CARE_PROVIDER_SITE_OTHER): Payer: Medicare Other | Admitting: Psychiatry

## 2019-06-10 ENCOUNTER — Encounter (HOSPITAL_COMMUNITY): Payer: Self-pay | Admitting: Psychiatry

## 2019-06-10 ENCOUNTER — Other Ambulatory Visit: Payer: Self-pay

## 2019-06-10 VITALS — Wt 195.0 lb

## 2019-06-10 DIAGNOSIS — F319 Bipolar disorder, unspecified: Secondary | ICD-10-CM

## 2019-06-10 DIAGNOSIS — F411 Generalized anxiety disorder: Secondary | ICD-10-CM

## 2019-06-10 MED ORDER — VENLAFAXINE HCL ER 150 MG PO CP24: 150.0000 mg | ORAL_CAPSULE | Freq: Every day | ORAL | 2 refills | Status: DC

## 2019-06-10 MED ORDER — ESCITALOPRAM OXALATE 20 MG PO TABS: 20.0000 mg | ORAL_TABLET | Freq: Every day | ORAL | 2 refills | Status: DC

## 2019-06-10 MED ORDER — HYDROXYZINE PAMOATE 25 MG PO CAPS: 25.0000 mg | ORAL_CAPSULE | Freq: Four times a day (QID) | ORAL | 2 refills | Status: DC | PRN

## 2019-06-10 MED ORDER — QUETIAPINE FUMARATE 300 MG PO TABS: 300.0000 mg | ORAL_TABLET | Freq: Every day | ORAL | 2 refills | Status: DC
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
## 2019-06-18 ENCOUNTER — Ambulatory Visit: Payer: Medicare Other | Admitting: Neurology

## 2019-06-20 ENCOUNTER — Ambulatory Visit (INDEPENDENT_AMBULATORY_CARE_PROVIDER_SITE_OTHER): Payer: Medicare Other | Admitting: Neurology

## 2019-06-20 ENCOUNTER — Encounter: Payer: Self-pay | Admitting: Neurology

## 2019-06-20 ENCOUNTER — Other Ambulatory Visit: Payer: Self-pay

## 2019-06-20 VITALS — BP 129/79 | HR 75 | Ht 68.0 in | Wt 200.0 lb

## 2019-06-20 DIAGNOSIS — G40009 Localization-related (focal) (partial) idiopathic epilepsy and epileptic syndromes with seizures of localized onset, not intractable, without status epilepticus: Secondary | ICD-10-CM | POA: Diagnosis not present

## 2019-06-20 MED ORDER — DIVALPROEX SODIUM 500 MG PO DR TAB: 1000.0000 mg | DELAYED_RELEASE_TABLET | Freq: Two times a day (BID) | ORAL | 11 refills | Status: DC
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
## 2019-07-16 ENCOUNTER — Telehealth: Payer: Self-pay | Admitting: Neurology

## 2019-07-16 NOTE — Telephone Encounter (Signed)
Patient called in wanting to follow up with someone about some medical release forms sent to our office for an upcoming surgery.

## 2019-07-17 NOTE — Telephone Encounter (Signed)
Advised to patient that Dr.Aquino has information, they will contact you when available.

## 2019-07-21 NOTE — Telephone Encounter (Signed)
Done, thanks

## 2019-07-21 NOTE — Telephone Encounter (Signed)
Patient called checking on the status of the medical clearance  For surgery forms. He is having surgery on 07-29-19 please call

## 2019-07-21 NOTE — Telephone Encounter (Signed)
Pt called and informed that his surgical clearance had been faxed

## 2019-08-17 ENCOUNTER — Other Ambulatory Visit: Payer: Self-pay

## 2019-08-17 ENCOUNTER — Emergency Department (HOSPITAL_COMMUNITY)
Admission: EM | Admit: 2019-08-17 | Discharge: 2019-08-17 | Disposition: A | Discharge status: Home / Self Care | Payer: Medicare Other | Attending: Emergency Medicine | Admitting: Emergency Medicine

## 2019-08-17 ENCOUNTER — Encounter (HOSPITAL_COMMUNITY): Payer: Self-pay | Admitting: Emergency Medicine

## 2019-08-17 DIAGNOSIS — Z5321 Procedure and treatment not carried out due to patient leaving prior to being seen by health care provider: Secondary | ICD-10-CM | POA: Insufficient documentation

## 2019-08-17 DIAGNOSIS — G8918 Other acute postprocedural pain: Secondary | ICD-10-CM | POA: Diagnosis present

## 2019-08-17 NOTE — ED Notes (Signed)
Called pt to recheck vitals. No response.  

## 2019-08-17 NOTE — ED Triage Notes (Signed)
Pt states he had R bicep surgery 2 weeks ago.  Reports pain to R bicep since yesterday.  Has not tried to contact orthopedist.

## 2019-08-17 NOTE — ED Notes (Signed)
Called PT three times no answer

## 2019-09-02 ENCOUNTER — Other Ambulatory Visit (HOSPITAL_COMMUNITY): Payer: Self-pay | Admitting: Psychiatry

## 2019-09-02 DIAGNOSIS — F319 Bipolar disorder, unspecified: Secondary | ICD-10-CM

## 2019-09-02 DIAGNOSIS — F411 Generalized anxiety disorder: Secondary | ICD-10-CM

## 2019-09-10 ENCOUNTER — Telehealth (HOSPITAL_COMMUNITY): Payer: Medicare Other | Admitting: Psychiatry

## 2019-09-11 ENCOUNTER — Telehealth (INDEPENDENT_AMBULATORY_CARE_PROVIDER_SITE_OTHER): Payer: Medicare Other | Admitting: Psychiatry

## 2019-09-11 ENCOUNTER — Encounter (HOSPITAL_COMMUNITY): Payer: Self-pay | Admitting: Psychiatry

## 2019-09-11 ENCOUNTER — Other Ambulatory Visit: Payer: Self-pay

## 2019-09-11 VITALS — Wt 197.0 lb

## 2019-09-11 DIAGNOSIS — F411 Generalized anxiety disorder: Secondary | ICD-10-CM

## 2019-09-11 DIAGNOSIS — F319 Bipolar disorder, unspecified: Secondary | ICD-10-CM

## 2019-09-11 MED ORDER — ESCITALOPRAM OXALATE 20 MG PO TABS: 20.0000 mg | ORAL_TABLET | Freq: Every day | ORAL | 2 refills | Status: DC

## 2019-09-11 MED ORDER — QUETIAPINE FUMARATE 300 MG PO TABS: 300.0000 mg | ORAL_TABLET | Freq: Every day | ORAL | 2 refills | Status: DC

## 2019-09-11 MED ORDER — HYDROXYZINE PAMOATE 25 MG PO CAPS: 25.0000 mg | ORAL_CAPSULE | Freq: Four times a day (QID) | ORAL | 2 refills | Status: DC | PRN

## 2019-09-11 MED ORDER — VENLAFAXINE HCL ER 150 MG PO CP24: 150.0000 mg | ORAL_CAPSULE | Freq: Every day | ORAL | 2 refills | Status: DC
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
## 2019-09-12 ENCOUNTER — Emergency Department (HOSPITAL_COMMUNITY)
Admission: EM | Admit: 2019-09-12 | Discharge: 2019-09-13 | Disposition: A | Discharge status: Home / Self Care | Payer: Medicare Other | Attending: Emergency Medicine | Admitting: Emergency Medicine

## 2019-09-12 ENCOUNTER — Encounter (HOSPITAL_COMMUNITY): Payer: Self-pay

## 2019-09-12 DIAGNOSIS — I10 Essential (primary) hypertension: Secondary | ICD-10-CM | POA: Insufficient documentation

## 2019-09-12 DIAGNOSIS — R1011 Right upper quadrant pain: Secondary | ICD-10-CM | POA: Diagnosis not present

## 2019-09-12 DIAGNOSIS — R109 Unspecified abdominal pain: Secondary | ICD-10-CM | POA: Diagnosis present

## 2019-09-12 DIAGNOSIS — Z79899 Other long term (current) drug therapy: Secondary | ICD-10-CM | POA: Insufficient documentation

## 2019-09-12 DIAGNOSIS — R1010 Upper abdominal pain, unspecified: Secondary | ICD-10-CM

## 2019-09-12 LAB — URINALYSIS, ROUTINE W REFLEX MICROSCOPIC
Bilirubin Urine: NEGATIVE
Glucose, UA: NEGATIVE mg/dL
Hgb urine dipstick: NEGATIVE
Ketones, ur: NEGATIVE mg/dL
Leukocytes,Ua: NEGATIVE
Nitrite: NEGATIVE
Protein, ur: NEGATIVE mg/dL
Specific Gravity, Urine: 1.012 (ref 1.005–1.030)
pH: 7 (ref 5.0–8.0)

## 2019-09-12 LAB — COMPREHENSIVE METABOLIC PANEL
ALT: 27 U/L (ref 0–44)
AST: 26 U/L (ref 15–41)
Albumin: 4.2 g/dL (ref 3.5–5.0)
Alkaline Phosphatase: 80 U/L (ref 38–126)
Anion gap: 10 (ref 5–15)
BUN: 6 mg/dL (ref 6–20)
CO2: 24 mmol/L (ref 22–32)
Calcium: 9.6 mg/dL (ref 8.9–10.3)
Chloride: 107 mmol/L (ref 98–111)
Creatinine, Ser: 1.18 mg/dL (ref 0.61–1.24)
GFR calc Af Amer: >60 mL/min (ref >60)
GFR calc non Af Amer: >60 mL/min (ref >60)
Glucose, Bld: 116 mg/dL — ABNORMAL HIGH (ref 70–99)
Potassium: 3.3 mmol/L — ABNORMAL LOW (ref 3.5–5.1)
Sodium: 141 mmol/L (ref 135–145)
Total Bilirubin: 0.5 mg/dL (ref 0.3–1.2)
Total Protein: 7.3 g/dL (ref 6.5–8.1)

## 2019-09-12 LAB — CBC
HCT: 34.8 % — ABNORMAL LOW (ref 39.0–52.0)
Hemoglobin: 11.4 g/dL — ABNORMAL LOW (ref 13.0–17.0)
MCH: 26.5 pg (ref 26.0–34.0)
MCHC: 32.8 g/dL (ref 30.0–36.0)
MCV: 80.7 fL (ref 80.0–100.0)
Platelets: 290 10*3/uL (ref 150–400)
RBC: 4.31 MIL/uL (ref 4.22–5.81)
RDW: 12.4 % (ref 11.5–15.5)
WBC: 6 10*3/uL (ref 4.0–10.5)
nRBC: 0 % (ref 0.0–0.2)

## 2019-09-12 LAB — LIPASE, BLOOD: Lipase: 28 U/L (ref 11–51)

## 2019-09-12 NOTE — ED Triage Notes (Signed)
Pt arrives POV for eval of RUQ abd pain since last night pt reports tried OTC meds no relief, no N/V.

## 2019-09-13 ENCOUNTER — Emergency Department (HOSPITAL_COMMUNITY): Payer: Medicare Other

## 2019-09-13 NOTE — Discharge Instructions (Addendum)
Follow-up with your doctor for further abdominal pain.  Return for worsening of symptoms

## 2019-09-13 NOTE — ED Provider Notes (Signed)
[facility] EMERGENCY DEPARTMENT Provider Note   CSN: [id number] Arrival date & time: 09/12/19  1946     History Chief Complaint  Patient presents with  . Abdominal Pain    Manuel Tucker is a 49 y.o. male.  HPI Patient presents with right upper quadrant abdominal pain.  Began last night and was worse this morning.  Improving now somewhat after around 1/2-hour wait in the waiting room.  No fevers.  No nausea or vomiting.  No dysuria.  States it hurts when he moves and when he breathes.  No coughing.  No trouble breathing.  No trauma.  No diarrhea or constipation.  Has not had pains like this before.  States he has been told he has had anemia in the past and sometimes needs to be on iron.    Past Medical History:  Diagnosis Date  . Anxiety   . Depression   . H/O idiopathic seizure   . History of ankle surgery   . History of back surgery   . Hypertension   . S/P insertion of spinal cord stimulator   . Seizures [facility])     Patient Active Problem List   Diagnosis Date Noted  . Tendinitis of right rotator cuff 04/23/2018  . Shoulder pain 02/27/2018    Past Surgical History:  Procedure Laterality Date  . BACK SURGERY    . FOOT SURGERY Right 2019  . SHOULDER SURGERY Right 2020       History reviewed. No pertinent family history.  Social History   Tobacco Use  . Smoking status: Never Smoker  . Smokeless tobacco: Never Used  Vaping Use  . Vaping Use: Never assessed  Substance Use Topics  . Alcohol use: Never  . Drug use: Never    Home Medications Prior to Admission medications   Medication Sig Start Date End Date Taking? Authorizing Provider  acetaminophen (TYLENOL) 500 MG tablet Take 1 tablet (500 mg total) by mouth every 6 (six) hours as needed. Patient taking differently: Take 500 mg by mouth every 6 (six) hours as needed for mild pain.  04/25/19  Yes Fawze, Mina A, PA-C  amLODipine (NORVASC) 10 MG tablet Take 10 mg by mouth daily.  10/02/18   Yes [provider]  cyclobenzaprine (FLEXERIL) 10 MG tablet Take 10 mg by mouth daily as needed for muscle spasms.  10/02/18  Yes [provider]  divalproex (DEPAKOTE) 500 MG DR tablet Take 2 tablets (1,000 mg total) by mouth 2 (two) times daily. 06/20/19  Yes Van Clines, MD  escitalopram (LEXAPRO) 20 MG tablet Take 1 tablet (20 mg total) by mouth daily. 09/11/19 09/10/20 Yes Arfeen, Phillips Grout, MD  hydrOXYzine (VISTARIL) 25 MG capsule Take 1 capsule (25 mg total) by mouth 4 (four) times daily as needed for anxiety. Patient taking differently: Take 25 mg by mouth in the morning, at noon, in the evening, and at bedtime.  09/11/19  Yes Arfeen, Phillips Grout, MD  ibuprofen (ADVIL) 600 MG tablet Take 1 tablet (600 mg total) by mouth every 6 (six) hours as needed. Patient taking differently: Take 600 mg by mouth every 6 (six) hours as needed for moderate pain.  04/25/19  Yes Fawze, Mina A, PA-C  lisinopril (ZESTRIL) 20 MG tablet Take 20 mg by mouth daily. 05/07/19  Yes Eartha Inch, MD  oxyCODONE-acetaminophen (PERCOCET) 5-325 MG tablet Take 1 tablet by mouth every 6 (six) hours as needed. Patient taking differently: Take 1 tablet by mouth every 6 (six)  hours as needed for moderate pain.  10/30/18  Yes Dartha Lodge, PA-C  QUEtiapine (SEROQUEL) 300 MG tablet Take 1 tablet (300 mg total) by mouth at bedtime. 09/11/19 09/10/20 Yes Arfeen, Phillips Grout, MD  simvastatin (ZOCOR) 20 MG tablet Take 20 mg by mouth every evening.  10/02/18  Yes [provider]  venlafaxine XR (EFFEXOR-XR) 150 MG 24 hr capsule Take 1 capsule (150 mg total) by mouth daily with breakfast. 09/11/19 09/10/20 Yes Arfeen, Phillips Grout, MD  clonazePAM (KLONOPIN) 0.5 MG tablet Take 1 tablet (0.5 mg total) by mouth daily as needed for anxiety. Patient not taking: Reported on 09/11/2019 01/13/19 01/13/20  Cleotis Nipper, MD    Allergies    Patient has no known allergies.  Review of Systems   Review of Systems  Constitutional: Negative  for appetite change.  HENT: Negative for congestion.   Respiratory: Negative for shortness of breath.   Gastrointestinal: Positive for abdominal pain.  Genitourinary: Negative for flank pain.  Musculoskeletal: Negative for back pain.  Skin: Negative for rash.  Neurological: Negative for weakness.  Psychiatric/Behavioral: Negative for confusion.    Physical Exam Updated Vital Signs BP 134/82   Pulse 62   Temp 99.4 F (37.4 C)   Resp 14   Ht 5\' 8"  (1.727 m)   Wt 88.5 kg   SpO2 100%   BMI 29.65 kg/m   Physical Exam Vitals and nursing note reviewed.  Constitutional:      Appearance: He is well-developed.  Cardiovascular:     Rate and Rhythm: Normal rate and regular rhythm.  Pulmonary:     Effort: No respiratory distress.     Comments: Lungs are clear without focal rales or rhonchi. Chest:     Chest wall: No tenderness.  Abdominal:     Hernia: No hernia is present.     Comments: Mild right upper quadrant tenderness without rebound or guarding.  Genitourinary:    Comments: No CVA tenderness Skin:    General: Skin is warm.     Capillary Refill: Capillary refill takes less than 2 seconds.  Neurological:     General: No focal deficit present.     Mental Status: He is alert.     ED Results / Procedures / Treatments   Labs (all labs ordered are listed, but only abnormal results are displayed) Labs Reviewed  COMPREHENSIVE METABOLIC PANEL - Abnormal; Notable for the following components:      Result Value   Potassium 3.3 (*)    Glucose, Bld 116 (*)    All other components within normal limits  CBC - Abnormal; Notable for the following components:   Hemoglobin 11.4 (*)    HCT 34.8 (*)    All other components within normal limits  URINALYSIS, ROUTINE W REFLEX MICROSCOPIC - Abnormal; Notable for the following components:   Bacteria, UA RARE (*)    All other components within normal limits  LIPASE, BLOOD    EKG None  Radiology Abdomen Complete  Result Date:  09/13/2019 CLINICAL DATA:  Upper abdominal pain EXAM: ABDOMEN ULTRASOUND COMPLETE COMPARISON:  None. FINDINGS: Gallbladder: Contracted gallbladder. No shadowing stone. Negative sonographic Murphy. Common bile duct: Diameter: 3 mm Liver: No focal lesion identified. Within normal limits in parenchymal echogenicity. Portal vein is patent on color Doppler imaging with normal direction of blood flow towards the liver. IVC: No abnormality visualized. Pancreas: Visualized portion unremarkable. Spleen: Size and appearance within normal limits. Right Kidney: Length: 10.6 cm. Echogenicity within normal limits. No  mass or hydronephrosis visualized. Left Kidney: Length: 10.4 cm. Echogenicity within normal limits. No mass or hydronephrosis visualized. Abdominal aorta: No aneurysm visualized. Other findings: None. IMPRESSION: Contracted gallbladder without sonographic evidence for acute gallbladder disease. Otherwise negative examination Electronically Signed   By: Jasmine Pang M.D.   On: 09/13/2019 01:26    Procedures Procedures (including critical care time)  Medications Ordered in ED Medications - No data to display  ED Course  I have reviewed the triage vital signs and the nursing notes.  Pertinent labs & imaging results that were available during my care of the patient were reviewed by me and considered in my medical decision making (see chart for details).    MDM Rules/Calculators/A&P                          Patient with upper abdominal pain.  Worse with movements and worse with some breathing.  Lungs are clear.  No nausea or vomiting.  No diarrhea.  No fevers.  No cough.  Did have surgery around 6 weeks ago on his bicep.  Had been worked up for blood clot in his lungs since then and was negative.  Pulmonary embolism felt less likely.  Gallbladder is contracted on ultrasound but otherwise no Murphy sign or stones.  Lab work reassuring except for mild anemia.  Does have history of same.  Will discharge  follow-up with PCP. Final Clinical Impression(s) / ED Diagnoses Final diagnoses:  Pain of upper abdomen    Rx / DC Orders ED Discharge Orders    None       Benjiman Core, MD 09/13/19 0159

## 2019-12-06 ENCOUNTER — Other Ambulatory Visit (HOSPITAL_COMMUNITY): Payer: Self-pay | Admitting: Psychiatry

## 2019-12-06 DIAGNOSIS — F411 Generalized anxiety disorder: Secondary | ICD-10-CM

## 2019-12-09 ENCOUNTER — Other Ambulatory Visit: Payer: Self-pay

## 2019-12-09 ENCOUNTER — Encounter (HOSPITAL_COMMUNITY): Payer: Self-pay | Admitting: Psychiatry

## 2019-12-09 ENCOUNTER — Telehealth (INDEPENDENT_AMBULATORY_CARE_PROVIDER_SITE_OTHER): Payer: Medicare Other | Admitting: Psychiatry

## 2019-12-09 DIAGNOSIS — F319 Bipolar disorder, unspecified: Secondary | ICD-10-CM

## 2019-12-09 DIAGNOSIS — F411 Generalized anxiety disorder: Secondary | ICD-10-CM | POA: Diagnosis not present

## 2019-12-09 MED ORDER — QUETIAPINE FUMARATE 300 MG PO TABS: 300.0000 mg | ORAL_TABLET | Freq: Every day | ORAL | 2 refills | Status: DC

## 2019-12-09 MED ORDER — ESCITALOPRAM OXALATE 20 MG PO TABS: 20.0000 mg | ORAL_TABLET | Freq: Every day | ORAL | 2 refills | Status: DC

## 2019-12-09 MED ORDER — HYDROXYZINE PAMOATE 25 MG PO CAPS: 25.0000 mg | ORAL_CAPSULE | Freq: Four times a day (QID) | ORAL | 2 refills | Status: DC | PRN

## 2019-12-09 MED ORDER — VENLAFAXINE HCL ER 150 MG PO CP24: 150.0000 mg | ORAL_CAPSULE | Freq: Every day | ORAL | 2 refills | Status: DC
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
## 2020-01-19 ENCOUNTER — Other Ambulatory Visit (HOSPITAL_COMMUNITY): Payer: Self-pay | Admitting: Psychiatry

## 2020-01-19 DIAGNOSIS — F411 Generalized anxiety disorder: Secondary | ICD-10-CM

## 2020-01-26 ENCOUNTER — Ambulatory Visit (INDEPENDENT_AMBULATORY_CARE_PROVIDER_SITE_OTHER): Payer: Medicare Other | Admitting: Neurology

## 2020-01-26 ENCOUNTER — Encounter: Payer: Self-pay | Admitting: Neurology

## 2020-01-26 ENCOUNTER — Other Ambulatory Visit: Payer: Self-pay

## 2020-01-26 VITALS — BP 169/68 | HR 98 | Ht 68.0 in | Wt 198.2 lb

## 2020-01-26 DIAGNOSIS — G40009 Localization-related (focal) (partial) idiopathic epilepsy and epileptic syndromes with seizures of localized onset, not intractable, without status epilepticus: Secondary | ICD-10-CM | POA: Diagnosis not present

## 2020-01-26 MED ORDER — DIVALPROEX SODIUM 500 MG PO DR TAB: 1000.0000 mg | DELAYED_RELEASE_TABLET | Freq: Two times a day (BID) | ORAL | 11 refills | Status: DC
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
## 2020-01-29 DIAGNOSIS — R002 Palpitations: Secondary | ICD-10-CM | POA: Insufficient documentation

## 2020-01-29 DIAGNOSIS — I2510 Atherosclerotic heart disease of native coronary artery without angina pectoris: Secondary | ICD-10-CM | POA: Insufficient documentation

## 2020-03-08 ENCOUNTER — Telehealth (INDEPENDENT_AMBULATORY_CARE_PROVIDER_SITE_OTHER): Payer: Medicare Other | Admitting: Psychiatry

## 2020-03-08 ENCOUNTER — Other Ambulatory Visit: Payer: Self-pay

## 2020-03-08 ENCOUNTER — Encounter (HOSPITAL_COMMUNITY): Payer: Self-pay | Admitting: Psychiatry

## 2020-03-08 DIAGNOSIS — F319 Bipolar disorder, unspecified: Secondary | ICD-10-CM | POA: Diagnosis not present

## 2020-03-08 DIAGNOSIS — F411 Generalized anxiety disorder: Secondary | ICD-10-CM | POA: Diagnosis not present

## 2020-03-08 MED ORDER — ESCITALOPRAM OXALATE 20 MG PO TABS: 20.0000 mg | ORAL_TABLET | Freq: Every day | ORAL | 2 refills | Status: DC

## 2020-03-08 MED ORDER — VENLAFAXINE HCL ER 150 MG PO CP24: 150.0000 mg | ORAL_CAPSULE | Freq: Every day | ORAL | 2 refills | Status: DC

## 2020-03-08 MED ORDER — HYDROXYZINE PAMOATE 25 MG PO CAPS: 25.0000 mg | ORAL_CAPSULE | Freq: Four times a day (QID) | ORAL | 2 refills | Status: DC | PRN

## 2020-03-08 MED ORDER — QUETIAPINE FUMARATE 300 MG PO TABS: 300.0000 mg | ORAL_TABLET | Freq: Every day | ORAL | 2 refills | Status: DC
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
## 2020-03-30 DIAGNOSIS — D649 Anemia, unspecified: Secondary | ICD-10-CM | POA: Insufficient documentation

## 2020-06-02 ENCOUNTER — Telehealth (HOSPITAL_COMMUNITY): Payer: Medicare Other | Admitting: Psychiatry

## 2020-06-11 ENCOUNTER — Telehealth (INDEPENDENT_AMBULATORY_CARE_PROVIDER_SITE_OTHER): Payer: Medicare Other | Admitting: Psychiatry

## 2020-06-11 ENCOUNTER — Encounter (HOSPITAL_COMMUNITY): Payer: Self-pay | Admitting: Psychiatry

## 2020-06-11 ENCOUNTER — Other Ambulatory Visit: Payer: Self-pay

## 2020-06-11 DIAGNOSIS — F319 Bipolar disorder, unspecified: Secondary | ICD-10-CM | POA: Diagnosis not present

## 2020-06-11 DIAGNOSIS — F411 Generalized anxiety disorder: Secondary | ICD-10-CM

## 2020-06-11 MED ORDER — QUETIAPINE FUMARATE 300 MG PO TABS: 300.0000 mg | ORAL_TABLET | Freq: Every day | ORAL | 2 refills | Status: DC

## 2020-06-11 MED ORDER — VENLAFAXINE HCL ER 150 MG PO CP24: 150.0000 mg | ORAL_CAPSULE | Freq: Every day | ORAL | 2 refills | Status: DC

## 2020-06-11 MED ORDER — HYDROXYZINE PAMOATE 25 MG PO CAPS: 25.0000 mg | ORAL_CAPSULE | Freq: Four times a day (QID) | ORAL | 2 refills | Status: DC | PRN

## 2020-06-11 MED ORDER — ESCITALOPRAM OXALATE 20 MG PO TABS: 20.0000 mg | ORAL_TABLET | Freq: Every day | ORAL | 2 refills | Status: DC
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
## 2020-09-17 ENCOUNTER — Other Ambulatory Visit: Payer: Self-pay

## 2020-09-17 ENCOUNTER — Encounter (HOSPITAL_COMMUNITY): Payer: Self-pay | Admitting: Psychiatry

## 2020-09-17 ENCOUNTER — Telehealth (INDEPENDENT_AMBULATORY_CARE_PROVIDER_SITE_OTHER): Payer: Medicare Other | Admitting: Psychiatry

## 2020-09-17 DIAGNOSIS — F411 Generalized anxiety disorder: Secondary | ICD-10-CM | POA: Diagnosis not present

## 2020-09-17 DIAGNOSIS — F319 Bipolar disorder, unspecified: Secondary | ICD-10-CM | POA: Diagnosis not present

## 2020-09-17 MED ORDER — VENLAFAXINE HCL ER 150 MG PO CP24: 150.0000 mg | ORAL_CAPSULE | Freq: Every day | ORAL | 1 refills | Status: DC

## 2020-09-17 MED ORDER — QUETIAPINE FUMARATE 300 MG PO TABS: 300.0000 mg | ORAL_TABLET | Freq: Every day | ORAL | 1 refills | Status: DC

## 2020-09-17 MED ORDER — HYDROXYZINE PAMOATE 50 MG PO CAPS: 50.0000 mg | ORAL_CAPSULE | Freq: Four times a day (QID) | ORAL | 1 refills | Status: DC | PRN

## 2020-09-17 MED ORDER — ESCITALOPRAM OXALATE 20 MG PO TABS: 20.0000 mg | ORAL_TABLET | Freq: Every day | ORAL | 1 refills | Status: DC
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
## 2020-09-30 ENCOUNTER — Telehealth (HOSPITAL_COMMUNITY): Payer: Self-pay | Admitting: *Deleted

## 2020-09-30 ENCOUNTER — Other Ambulatory Visit (HOSPITAL_COMMUNITY): Payer: Self-pay | Admitting: Psychiatry

## 2020-09-30 MED ORDER — QUETIAPINE FUMARATE 25 MG PO TABS: 25.0000 mg | ORAL_TABLET | Freq: Two times a day (BID) | ORAL | 2 refills | Status: DC

## 2020-09-30 NOTE — Telephone Encounter (Signed)
Tell him I sent in Seroquel 25 mg , can be used up to bid

## 2020-09-30 NOTE — Telephone Encounter (Signed)
Pt of Dr. Lolly Mustache who has called with c/o increasing anxiety and "almost panic attacks". Vistaril 50 mg recently increased to tid to qid prn to which pt states that it is "not helping at all". Per Dr. Sheela Stack last note they discussed adding a low dose of Seroquel in the am (he takes 300 mg hs). Pt has tried Buspar and Gabapentin without success. Pt has an appointment scheduled for 11/16/20. Please review and advise. Thanks.

## 2020-10-01 ENCOUNTER — Other Ambulatory Visit (HOSPITAL_COMMUNITY): Payer: Self-pay | Admitting: Psychiatry

## 2020-10-01 DIAGNOSIS — F411 Generalized anxiety disorder: Secondary | ICD-10-CM

## 2020-10-05 ENCOUNTER — Ambulatory Visit (HOSPITAL_COMMUNITY): Payer: Medicare Other | Admitting: Licensed Clinical Social Worker

## 2020-10-06 ENCOUNTER — Ambulatory Visit (HOSPITAL_COMMUNITY): Payer: Medicare Other | Admitting: Licensed Clinical Social Worker

## 2020-10-07 ENCOUNTER — Other Ambulatory Visit: Payer: Self-pay

## 2020-10-07 ENCOUNTER — Encounter (HOSPITAL_COMMUNITY): Payer: Self-pay

## 2020-10-07 ENCOUNTER — Ambulatory Visit (INDEPENDENT_AMBULATORY_CARE_PROVIDER_SITE_OTHER): Payer: Medicare Other | Admitting: Licensed Clinical Social Worker

## 2020-10-07 DIAGNOSIS — F411 Generalized anxiety disorder: Secondary | ICD-10-CM
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
## 2020-10-16 ENCOUNTER — Emergency Department (HOSPITAL_BASED_OUTPATIENT_CLINIC_OR_DEPARTMENT_OTHER): Payer: Medicare Other | Admitting: Radiology

## 2020-10-16 ENCOUNTER — Emergency Department (HOSPITAL_BASED_OUTPATIENT_CLINIC_OR_DEPARTMENT_OTHER)
Admission: EM | Admit: 2020-10-16 | Discharge: 2020-10-16 | Disposition: A | Discharge status: Home / Self Care | Payer: Medicare Other | Attending: Emergency Medicine | Admitting: Emergency Medicine

## 2020-10-16 ENCOUNTER — Encounter (HOSPITAL_BASED_OUTPATIENT_CLINIC_OR_DEPARTMENT_OTHER): Payer: Self-pay | Admitting: *Deleted

## 2020-10-16 ENCOUNTER — Other Ambulatory Visit: Payer: Self-pay

## 2020-10-16 DIAGNOSIS — I10 Essential (primary) hypertension: Secondary | ICD-10-CM | POA: Insufficient documentation

## 2020-10-16 DIAGNOSIS — Z79899 Other long term (current) drug therapy: Secondary | ICD-10-CM | POA: Insufficient documentation

## 2020-10-16 DIAGNOSIS — R002 Palpitations: Secondary | ICD-10-CM | POA: Insufficient documentation

## 2020-10-16 LAB — BASIC METABOLIC PANEL
Anion gap: 12 (ref 5–15)
BUN: 11 mg/dL (ref 6–20)
CO2: 25 mmol/L (ref 22–32)
Calcium: 9.8 mg/dL (ref 8.9–10.3)
Chloride: 102 mmol/L (ref 98–111)
Creatinine, Ser: 1.25 mg/dL — ABNORMAL HIGH (ref 0.61–1.24)
GFR, Estimated: >60 mL/min (ref >60)
Glucose, Bld: 90 mg/dL (ref 70–99)
Potassium: 3.6 mmol/L (ref 3.5–5.1)
Sodium: 139 mmol/L (ref 135–145)

## 2020-10-16 LAB — CBC
HCT: 33 % — ABNORMAL LOW (ref 39.0–52.0)
Hemoglobin: 11.4 g/dL — ABNORMAL LOW (ref 13.0–17.0)
MCH: 27.1 pg (ref 26.0–34.0)
MCHC: 34.5 g/dL (ref 30.0–36.0)
MCV: 78.6 fL — ABNORMAL LOW (ref 80.0–100.0)
Platelets: 248 10*3/uL (ref 150–400)
RBC: 4.2 MIL/uL — ABNORMAL LOW (ref 4.22–5.81)
RDW: 12.6 % (ref 11.5–15.5)
WBC: 9.6 10*3/uL (ref 4.0–10.5)
nRBC: 0 % (ref 0.0–0.2)

## 2020-10-16 LAB — MAGNESIUM: Magnesium: 1.9 mg/dL (ref 1.7–2.4)

## 2020-10-16 LAB — TROPONIN I (HIGH SENSITIVITY): Troponin I (High Sensitivity): 4 ng/L (ref <18)

## 2020-10-16 NOTE — ED Provider Notes (Signed)
[facility] EMERGENCY DEPT Provider Note   CSN: [id number] Arrival date & time: 10/16/20  1743     History Chief Complaint  Patient presents with   Palpitations    Manuel Tucker is a 50 y.o. male.  HPI  50 year old male with past medical history of anxiety, HTN presents the emergency department after an episode of palpitations and sweatiness.  Patient states that he has had episodes of palpitations in the past.  Usually related to his anxiety.  Today when he had palpitations he had associated sweatiness which was new for him so he called 911.  Patient denied any associated chest or back pain.  No shortness of breath.  All the symptoms has resolved and he feels back to baseline.  No recent fever, illness, cough.  Past Medical History:  Diagnosis Date   Anxiety    Depression    H/O idiopathic seizure    History of ankle surgery    History of back surgery    Hypertension    S/P insertion of spinal cord stimulator    Seizures [facility])     Patient Active Problem List   Diagnosis Date Noted   Tendinitis of right rotator cuff 04/23/2018   Shoulder pain 02/27/2018    Past Surgical History:  Procedure Laterality Date   BACK SURGERY     BICEPS TENDON REPAIR Right 2021   FOOT SURGERY Right 2019   SHOULDER SURGERY Right 2020       No family history on file.  Social History   Tobacco Use   Smoking status: Never   Smokeless tobacco: Never  Substance Use Topics   Alcohol use: Never   Drug use: Never    Home Medications Prior to Admission medications   Medication Sig Start Date End Date Taking? Authorizing Provider  acetaminophen (TYLENOL) 500 MG tablet Take 1 tablet (500 mg total) by mouth every 6 (six) hours as needed. Patient taking differently: Take 500 mg by mouth every 6 (six) hours as needed for mild pain. 04/25/19   Fawze, Mina A, PA-C  amLODipine (NORVASC) 10 MG tablet Take 10 mg by mouth daily.  10/02/18   [provider]  aspirin 81 MG  EC tablet Take 81 mg by mouth daily. 01/29/20   [provider]  carvedilol (COREG) 3.125 MG tablet Take by mouth. 01/29/20   [provider]  cyclobenzaprine (FLEXERIL) 10 MG tablet Take 10 mg by mouth daily as needed for muscle spasms.  10/02/18   [provider]  divalproex (DEPAKOTE) 500 MG DR tablet Take 2 tablets (1,000 mg total) by mouth 2 (two) times daily. 01/26/20   Van Clines, MD  escitalopram (LEXAPRO) 20 MG tablet Take 1 tablet (20 mg total) by mouth daily. 09/17/20 09/17/21  Arfeen, Phillips Grout, MD  hydrOXYzine (VISTARIL) 50 MG capsule Take 1 capsule (50 mg total) by mouth 4 (four) times daily as needed for anxiety. 09/17/20   Arfeen, Phillips Grout, MD  ibuprofen (ADVIL) 600 MG tablet Take 1 tablet (600 mg total) by mouth every 6 (six) hours as needed. Patient taking differently: Take 600 mg by mouth every 6 (six) hours as needed for moderate pain. 04/25/19   Fawze, Mina A, PA-C  lisinopril (ZESTRIL) 20 MG tablet Take 20 mg by mouth daily. 05/07/19   Eartha Inch, MD  meloxicam (MOBIC) 15 MG tablet Take 1 tablet by mouth daily. 06/01/20   [provider]  QUEtiapine (SEROQUEL) 25 MG tablet Take 1 tablet (25 mg total)  by mouth 2 (two) times daily. 09/30/20 09/30/21  Myrlene Broker, MD  QUEtiapine (SEROQUEL) 300 MG tablet Take 1 tablet (300 mg total) by mouth at bedtime. 09/17/20 09/17/21  Arfeen, Phillips Grout, MD  simvastatin (ZOCOR) 20 MG tablet Take 20 mg by mouth every evening.  10/02/18   [provider]  venlafaxine XR (EFFEXOR-XR) 150 MG 24 hr capsule Take 1 capsule (150 mg total) by mouth daily with breakfast. 09/17/20 09/17/21  Arfeen, Phillips Grout, MD    Allergies    Patient has no known allergies.  Review of Systems   Review of Systems  Constitutional:  Positive for diaphoresis. Negative for chills and fever.  HENT:  Negative for congestion.   Eyes:  Negative for visual disturbance.  Respiratory:  Negative for chest tightness and shortness of breath.    Cardiovascular:  Positive for palpitations. Negative for chest pain and leg swelling.  Gastrointestinal:  Negative for abdominal pain, diarrhea and vomiting.  Genitourinary:  Negative for dysuria.  Skin:  Negative for rash.  Neurological:  Negative for syncope and headaches.   Physical Exam Updated Vital Signs BP (!) 153/79 (BP Location: Right Arm)   Pulse 78   Temp 100.1 F (37.8 C) (Oral)   Resp 16   Wt 88.5 kg   SpO2 98%   BMI 29.65 kg/m   Physical Exam Vitals and nursing note reviewed.  Constitutional:      General: He is not in acute distress.    Appearance: Normal appearance.  HENT:     Head: Normocephalic.     Mouth/Throat:     Mouth: Mucous membranes are moist.  Cardiovascular:     Rate and Rhythm: Normal rate.  Pulmonary:     Effort: Pulmonary effort is normal. No respiratory distress.  Abdominal:     Palpations: Abdomen is soft.     Tenderness: There is no abdominal tenderness.  Musculoskeletal:        General: No swelling.  Skin:    General: Skin is warm.  Neurological:     Mental Status: He is alert and oriented to person, place, and time. Mental status is at baseline.  Psychiatric:        Mood and Affect: Mood normal.    ED Results / Procedures / Treatments   Labs (all labs ordered are listed, but only abnormal results are displayed) Labs Reviewed  BASIC METABOLIC PANEL - Abnormal; Notable for the following components:      Result Value   Creatinine, Ser 1.25 (*)    All other components within normal limits  CBC - Abnormal; Notable for the following components:   RBC 4.20 (*)    Hemoglobin 11.4 (*)    HCT 33.0 (*)    MCV 78.6 (*)    All other components within normal limits  MAGNESIUM  TROPONIN I (HIGH SENSITIVITY)    EKG EKG Interpretation  Date/Time:  Saturday October 16 2020 18:11:07 EDT Ventricular Rate:  84 PR Interval:  144 QRS Duration: 88 QT Interval:  366 QTC Calculation: 432 R Axis:   -10 Text Interpretation: Normal  sinus rhythm Possible Anterior infarct , age undetermined Abnormal ECG NSR, no previous Confirmed by Coralee Pesa [phone]) on 10/16/2020 9:13:35 PM  Radiology DG Chest 2 View  Result Date: 10/16/2020 CLINICAL DATA:  Chest palpitations EXAM: CHEST - 2 VIEW COMPARISON:  None. FINDINGS: The heart size and mediastinal contours are within normal limits. Both lungs are clear. Disc degenerative disease of the thoracic spine. Epidural stimulator  leads of the thoracic spine IMPRESSION: No acute abnormality of the lungs. Electronically Signed   By: Lauralyn Primes M.D.   On: 10/16/2020 18:51    Procedures Procedures   Medications Ordered in ED Medications - No data to display  ED Course  I have reviewed the triage vital signs and the nursing notes.  Pertinent labs & imaging results that were available during my care of the patient were reviewed by me and considered in my medical decision making (see chart for details).    MDM Rules/Calculators/A&P                           50 year old male presents emergency department after episode of palpitations.  No chest pain or shortness of breath.  Symptoms have resolved.  Vitals are stable on arrival.  EKG shows normal sinus rhythm.  Blood work is normal, troponin is negative, electrolytes are normal.  Chest x-ray is unremarkable.  Low suspicion for ACS/PE or other acute pathology given his presentation and resolved symptoms with normal work-up.  Patient at this time appears safe and stable for discharge and will be treated as an outpatient.  Discharge plan and strict return to ED precautions discussed, patient verbalizes understanding and agreement.  Final Clinical Impression(s) / ED Diagnoses Final diagnoses:  Palpitations    Rx / DC Orders ED Discharge Orders     None        Rozelle Logan, DO 10/16/20 2236

## 2020-10-16 NOTE — ED Triage Notes (Signed)
Pt reports that he began having palpitations and he became sweaty and he had cramping in his calfs.  Pt called 911 and was brought in by ems.  Pt states that he feels "tired and cold" but the palpitations and diaphoresis has resolved. Pt has hx of anxiety and reports some stress in the family.

## 2020-10-16 NOTE — Discharge Instructions (Addendum)
You have been seen and discharged from the emergency department.  Your blood work and heart work-up was normal.  Follow-up with your primary provider for reevaluation and further care. Take home medications as prescribed. If you have any worsening symptoms or further concerns for your health please return to an emergency department for further evaluation.

## 2020-10-16 NOTE — ED Triage Notes (Signed)
Pt brought in by EMS. Pt has history of anxiety. Pt felt like he was having heart palpitations and called 911.EMS pt stated he felt better on their arrival but started feeling his heart fluttering when they started to leave.

## 2020-10-19 ENCOUNTER — Other Ambulatory Visit: Payer: Self-pay

## 2020-10-19 ENCOUNTER — Ambulatory Visit (INDEPENDENT_AMBULATORY_CARE_PROVIDER_SITE_OTHER): Payer: Medicare Other | Admitting: Licensed Clinical Social Worker

## 2020-10-19 DIAGNOSIS — F411 Generalized anxiety disorder: Secondary | ICD-10-CM

## 2020-10-19 NOTE — Progress Notes (Signed)
Virtual Visit via Telephone Note  I connected with Manuel Tucker on 10/19/20 at  1:00 PM EDT by telephone and verified that I am speaking with the correct person using two identifiers.  Location: Patient: home Provider: office   I discussed the limitations, risks, security and privacy concerns of performing an evaluation and management service by telephone and the availability of in person appointments. I also discussed with the patient that there may be a patient responsible charge related to this service. The patient expressed understanding and agreed to proceed.  I discussed the assessment and treatment plan with the patient. The patient was provided an opportunity to ask questions and all were answered. The patient agreed with the plan and demonstrated an understanding of the instructions.   The patient was advised to call back or seek an in-person evaluation if the symptoms worsen or if the condition fails to improve as anticipated.  I provided 45 minutes of non-face-to-face time during this encounter.   [name], LCSW    THERAPIST PROGRESS NOTE  Session Time: 1-145pm  Participation Level: Active  Behavioral Response: NAAlertEuthymic  Type of Therapy: Individual Therapy  Treatment Goals addressed: Anxiety  Interventions: CBT, DBT, and Supportive  Summary: Manuel Tucker is a 50 y.o. male who presents with anxiety. Client processed recent loss as well as both anxiety and relief related to mother. Client processed hospital visit recently due to the intensity of physical symptoms of anxiety mimicking heart attack which was of concern to him. Client shared distraction skills which are sometimes helpful if anxiety can be caught at a low level. Client engaged in 5 senses activity and was receptive to practicing breathing skills daily when feeling no anxiety for an easier transition.   Suicidal/Homicidal: Nowithout intent/plan  Therapist Response: Clinician checked in  with client, assessed for SI/HI/psychosis and overall level of functioning. Clinician provided 5 senses grounding activity to alternate with distraction skills. Clinician encouraged client to try mindfulness for addressing physical symptoms rather than occupying brain with physical activities. Clinician will provide supplemental material on types of breathing as well as follow up on previously sent video for mindfulness activity for review.  Plan: Return again in 2 weeks.  Diagnosis: Axis I: Generalized Anxiety Disorder     [name], LCSW 10/19/2020

## 2020-10-21 DIAGNOSIS — G40909 Epilepsy, unspecified, not intractable, without status epilepticus: Secondary | ICD-10-CM | POA: Insufficient documentation

## 2020-10-21 DIAGNOSIS — D571 Sickle-cell disease without crisis: Secondary | ICD-10-CM | POA: Insufficient documentation

## 2020-10-21 DIAGNOSIS — I10 Essential (primary) hypertension: Secondary | ICD-10-CM | POA: Insufficient documentation

## 2020-10-21 DIAGNOSIS — G4700 Insomnia, unspecified: Secondary | ICD-10-CM | POA: Insufficient documentation

## 2020-10-22 ENCOUNTER — Institutional Professional Consult (permissible substitution): Payer: Medicare Other | Admitting: Pulmonary Disease

## 2020-10-28 ENCOUNTER — Institutional Professional Consult (permissible substitution): Payer: Medicare Other | Admitting: Internal Medicine

## 2020-11-02 ENCOUNTER — Other Ambulatory Visit: Payer: Self-pay

## 2020-11-02 ENCOUNTER — Ambulatory Visit (INDEPENDENT_AMBULATORY_CARE_PROVIDER_SITE_OTHER): Payer: Medicare Other | Admitting: Licensed Clinical Social Worker

## 2020-11-02 DIAGNOSIS — F411 Generalized anxiety disorder: Secondary | ICD-10-CM

## 2020-11-02 NOTE — Progress Notes (Signed)
Virtual Visit via Telephone Note  I connected with Albertina Parr on 11/02/20 at 11:00 AM EDT by telephone and verified that I am speaking with the correct person using two identifiers.  Location: Patient: home Provider: office   I discussed the limitations, risks, security and privacy concerns of performing an evaluation and management service by telephone and the availability of in person appointments. I also discussed with the patient that there may be a patient responsible charge related to this service. The patient expressed understanding and agreed to proceed.   I discussed the assessment and treatment plan with the patient. The patient was provided an opportunity to ask questions and all were answered. The patient agreed with the plan and demonstrated an understanding of the instructions.   The patient was advised to call back or seek an in-person evaluation if the symptoms worsen or if the condition fails to improve as anticipated.  I provided 45 minutes of non-face-to-face time during this encounter.   [name], LCSW    THERAPIST PROGRESS NOTE  Session Time: [phone]  Participation Level: Active  Behavioral Response: NAAlertEuthymic  Type of Therapy: Individual Therapy  Treatment Goals addressed: Anxiety and Diagnosis: decrease panic attacks to less than 4 times monthly.  Interventions: CBT and Other: mindfulness  Summary: Markail Diekman is a 50 y.o. male who presents with generalized anxiety. Client showed progress toward goal AEB no panic attacks since last session and practicing 5 sense activity outside at least once since last session. Client reports sharing skills and videos with wife for help in prompting use of skills when anxious. Client shared time planned to spend with family which improves his mood regularly.  Suicidal/Homicidal: No  Therapist Response: Clinician checked in with client, assessed for Si/HI/psychosis and overall level of functioning.  Clinician reiewed with client effectiveness of practiced skills over the past weeks. Clinician and client processed the importance of practicing relaxation skills while calm and in multiple settings to help became a more natural response to anxiety. Clinician provided breathing worksheet and mindfulness video for supportive skills to practice at home.  Plan: Return again in 3 weeks.  Diagnosis: Axis I: Generalized Anxiety Disorder     [name], LCSW 11/02/2020

## 2020-11-15 ENCOUNTER — Emergency Department (HOSPITAL_BASED_OUTPATIENT_CLINIC_OR_DEPARTMENT_OTHER): Payer: Medicare Other | Admitting: Radiology

## 2020-11-15 ENCOUNTER — Other Ambulatory Visit: Payer: Self-pay

## 2020-11-15 ENCOUNTER — Encounter (HOSPITAL_BASED_OUTPATIENT_CLINIC_OR_DEPARTMENT_OTHER): Payer: Self-pay | Admitting: Emergency Medicine

## 2020-11-15 ENCOUNTER — Emergency Department (HOSPITAL_BASED_OUTPATIENT_CLINIC_OR_DEPARTMENT_OTHER)
Admission: EM | Admit: 2020-11-15 | Discharge: 2020-11-15 | Disposition: A | Discharge status: Home / Self Care | Payer: Medicare Other | Attending: Emergency Medicine | Admitting: Emergency Medicine

## 2020-11-15 DIAGNOSIS — M549 Dorsalgia, unspecified: Secondary | ICD-10-CM | POA: Diagnosis present

## 2020-11-15 DIAGNOSIS — S39012A Strain of muscle, fascia and tendon of lower back, initial encounter: Secondary | ICD-10-CM

## 2020-11-15 MED ORDER — KETOROLAC TROMETHAMINE 30 MG/ML IJ SOLN
30.0000 mg | Freq: Once | INTRAMUSCULAR | Status: AC
  Administered 2020-11-15: 30 mg via INTRAMUSCULAR
  Filled 2020-11-15: qty 1

## 2020-11-15 NOTE — ED Notes (Signed)
Patient verbalizes understanding of discharge instructions. Opportunity for questioning and answers were provided. Patient discharged from ED.  °

## 2020-11-15 NOTE — Discharge Instructions (Addendum)
Recommend Tylenol or anti-inflammatory such as Motrin or Mobic for pain control.  No return to ER if you develop uncontrolled pain, chest pain, difficulty breathing or other new concerning symptom.

## 2020-11-15 NOTE — ED Triage Notes (Signed)
Pt arrives to ED with c/o of mid-thoracic back pain that started today. Pt reports he slipped this morning while walking the dogs on wet pavement. He reports he twisted and fell backwards. He caught himself but reports the twisting of his body caused the pain. He did not land on his back.

## 2020-11-15 NOTE — ED Provider Notes (Signed)
[facility] EMERGENCY DEPT Provider Note   CSN: [id number] Arrival date & time: 11/15/20  1036     History Chief Complaint  Patient presents with   Back Pain    Manuel Tucker is a 50 y.o. male.  Presents to ER with concern for mid back pain.  States that pain happened after slip this morning while walking dogs on wet pavement.  Did not hit the ground hard per se but he said he had a awkward twisting motion that triggered the pain.  Pain is worse with certain positions and movements, improved with rest.  No head trauma.  No syncope.  No numbness or weakness or tingling in any of his extremities.  No chest pain or difficulty in breathing.  No abdominal pain.  HPI     Past Medical History:  Diagnosis Date   Anxiety    Depression    H/O idiopathic seizure    History of ankle surgery    History of back surgery    Hypertension    S/P insertion of spinal cord stimulator    Seizures [facility])     Patient Active Problem List   Diagnosis Date Noted   Tendinitis of right rotator cuff 04/23/2018   Shoulder pain 02/27/2018    Past Surgical History:  Procedure Laterality Date   BACK SURGERY     BICEPS TENDON REPAIR Right 2021   FOOT SURGERY Right 2019   SHOULDER SURGERY Right 2020       History reviewed. No pertinent family history.  Social History   Tobacco Use   Smoking status: Never   Smokeless tobacco: Never  Substance Use Topics   Alcohol use: Never   Drug use: Never    Home Medications Prior to Admission medications   Medication Sig Start Date End Date Taking? Authorizing Provider  acetaminophen (TYLENOL) 500 MG tablet Take 1 tablet (500 mg total) by mouth every 6 (six) hours as needed. Patient taking differently: Take 500 mg by mouth every 6 (six) hours as needed for mild pain. 04/25/19   Fawze, Mina A, PA-C  amLODipine (NORVASC) 10 MG tablet Take 10 mg by mouth daily.  10/02/18   [provider]  aspirin 81 MG EC tablet Take 81 mg by  mouth daily. 01/29/20   [provider]  carvedilol (COREG) 3.125 MG tablet Take by mouth. 01/29/20   [provider]  cyclobenzaprine (FLEXERIL) 10 MG tablet Take 10 mg by mouth daily as needed for muscle spasms.  10/02/18   [provider]  divalproex (DEPAKOTE) 500 MG DR tablet Take 2 tablets (1,000 mg total) by mouth 2 (two) times daily. 01/26/20   Van Clines, MD  escitalopram (LEXAPRO) 20 MG tablet Take 1 tablet (20 mg total) by mouth daily. 09/17/20 09/17/21  Arfeen, Phillips Grout, MD  hydrOXYzine (VISTARIL) 50 MG capsule Take 1 capsule (50 mg total) by mouth 4 (four) times daily as needed for anxiety. 09/17/20   Arfeen, Phillips Grout, MD  ibuprofen (ADVIL) 600 MG tablet Take 1 tablet (600 mg total) by mouth every 6 (six) hours as needed. Patient taking differently: Take 600 mg by mouth every 6 (six) hours as needed for moderate pain. 04/25/19   Fawze, Mina A, PA-C  lisinopril (ZESTRIL) 20 MG tablet Take 20 mg by mouth daily. 05/07/19   Eartha Inch, MD  meloxicam (MOBIC) 15 MG tablet Take 1 tablet by mouth daily. 06/01/20   [provider]  QUEtiapine (SEROQUEL) 25 MG tablet Take 1  tablet (25 mg total) by mouth 2 (two) times daily. 09/30/20 09/30/21  Myrlene Broker, MD  QUEtiapine (SEROQUEL) 300 MG tablet Take 1 tablet (300 mg total) by mouth at bedtime. 09/17/20 09/17/21  Arfeen, Phillips Grout, MD  simvastatin (ZOCOR) 20 MG tablet Take 20 mg by mouth every evening.  10/02/18   [provider]  venlafaxine XR (EFFEXOR-XR) 150 MG 24 hr capsule Take 1 capsule (150 mg total) by mouth daily with breakfast. 09/17/20 09/17/21  Arfeen, Phillips Grout, MD    Allergies    Banana  Review of Systems   Review of Systems  Constitutional:  Negative for chills and fever.  HENT:  Negative for ear pain and sore throat.   Eyes:  Negative for pain and visual disturbance.  Respiratory:  Negative for cough and shortness of breath.   Cardiovascular:  Negative for chest pain and palpitations.   Gastrointestinal:  Negative for abdominal pain and vomiting.  Genitourinary:  Negative for dysuria and hematuria.  Musculoskeletal:  Positive for back pain. Negative for arthralgias.  Skin:  Negative for color change and rash.  Neurological:  Negative for seizures and syncope.  All other systems reviewed and are negative.  Physical Exam Updated Vital Signs BP (!) 143/83 (BP Location: Right Arm)   Pulse 70   Temp 98.3 F (36.8 C) (Temporal)   Resp 18   Ht 5\' 8"  (1.727 m)   Wt 92.5 kg   SpO2 98%   BMI 31.02 kg/m   Physical Exam Vitals and nursing note reviewed.  Constitutional:      Appearance: He is well-developed.  HENT:     Head: Normocephalic and atraumatic.  Eyes:     Conjunctiva/sclera: Conjunctivae normal.  Cardiovascular:     Rate and Rhythm: Normal rate.     Pulses: Normal pulses.  Pulmonary:     Effort: Pulmonary effort is normal. No respiratory distress.     Breath sounds: Normal breath sounds.  Abdominal:     Palpations: Abdomen is soft.     Tenderness: There is no abdominal tenderness.  Musculoskeletal:        General: No deformity or signs of injury.     Cervical back: Neck supple.     Comments: Some tenderness to the mid thoracic region, no step-off or deformity appreciated  Skin:    General: Skin is warm and dry.  Neurological:     General: No focal deficit present.     Mental Status: He is alert.    ED Results / Procedures / Treatments   Labs (all labs ordered are listed, but only abnormal results are displayed) Labs Reviewed - No data to display  EKG None  Radiology DG Thoracic Spine 2 View  Result Date: 11/15/2020 CLINICAL DATA:  Midthoracic pain that started today. Patient slipped and fell earlier today. EXAM: THORACIC SPINE 2 VIEWS COMPARISON:  Chest radiographs 10/16/2020. FINDINGS: The cervicothoracic junction is not optimally visualized in the lateral projection, despite a swimmer's view. The alignment appears normal aside from a  mild scoliosis. There is no evidence of acute fracture, widening of the interpedicular distance or paraspinal abnormality. There are mild degenerative changes in the thoracic spine. A thoracic spinal stimulator is noted. IMPRESSION: No evidence of acute thoracic spine injury. Electronically Signed   By: 10/18/2020 M.D.   On: 11/15/2020 14:04    Procedures Procedures   Medications Ordered in ED Medications  ketorolac (TORADOL) 30 MG/ML injection 30 mg (30 mg Intramuscular Given 11/15/20 1321)  ED Course  I have reviewed the triage vital signs and the nursing notes.  Pertinent labs & imaging results that were available during my care of the patient were reviewed by me and considered in my medical decision making (see chart for details).    MDM Rules/Calculators/A&P                           50 year old presents to ER with mid thoracic back pain after twisting motion prior to arrival.  On exam he appears well in no distress.  Neuro intact.  Plain films of thoracic spine within normal limits.  No acute trauma identified.  Suspect MSK strain.  Recommend supportive care.   After the discussed management above, the patient was determined to be safe for discharge.  The patient was in agreement with this plan and all questions regarding their care were answered.  ED return precautions were discussed and the patient will return to the ED with any significant worsening of condition.  Final Clinical Impression(s) / ED Diagnoses Final diagnoses:  Back strain, initial encounter    Rx / DC Orders ED Discharge Orders     None        Milagros Loll, MD 11/15/20 2048

## 2020-11-16 ENCOUNTER — Telehealth (HOSPITAL_BASED_OUTPATIENT_CLINIC_OR_DEPARTMENT_OTHER): Payer: Medicare Other | Admitting: Psychiatry

## 2020-11-16 ENCOUNTER — Encounter (HOSPITAL_COMMUNITY): Payer: Self-pay | Admitting: Psychiatry

## 2020-11-16 VITALS — Wt 204.0 lb

## 2020-11-16 DIAGNOSIS — F319 Bipolar disorder, unspecified: Secondary | ICD-10-CM | POA: Diagnosis not present

## 2020-11-16 DIAGNOSIS — F411 Generalized anxiety disorder: Secondary | ICD-10-CM | POA: Diagnosis not present

## 2020-11-16 DIAGNOSIS — F410 Panic disorder [episodic paroxysmal anxiety] without agoraphobia: Secondary | ICD-10-CM | POA: Diagnosis not present

## 2020-11-16 MED ORDER — QUETIAPINE FUMARATE 300 MG PO TABS: 300.0000 mg | ORAL_TABLET | Freq: Every day | ORAL | 2 refills | Status: DC

## 2020-11-16 MED ORDER — HYDROXYZINE PAMOATE 50 MG PO CAPS: 50.0000 mg | ORAL_CAPSULE | Freq: Four times a day (QID) | ORAL | 2 refills | Status: DC | PRN

## 2020-11-16 MED ORDER — VENLAFAXINE HCL ER 150 MG PO CP24: 150.0000 mg | ORAL_CAPSULE | Freq: Every day | ORAL | 2 refills | Status: DC

## 2020-11-16 MED ORDER — ESCITALOPRAM OXALATE 20 MG PO TABS: 20.0000 mg | ORAL_TABLET | Freq: Every day | ORAL | 2 refills | Status: DC
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
## 2020-11-23 ENCOUNTER — Other Ambulatory Visit: Payer: Self-pay

## 2020-11-23 ENCOUNTER — Ambulatory Visit (INDEPENDENT_AMBULATORY_CARE_PROVIDER_SITE_OTHER): Payer: Medicare Other | Admitting: Licensed Clinical Social Worker

## 2020-11-23 DIAGNOSIS — F411 Generalized anxiety disorder: Secondary | ICD-10-CM | POA: Diagnosis not present

## 2020-11-23 NOTE — Progress Notes (Signed)
Virtual Visit via Telephone Note  I connected with Manuel Tucker on 11/23/20 at 11:00 AM EDT by telephone and verified that I am speaking with the correct person using two identifiers.  Location: Patient: home Provider: office   I discussed the limitations, risks, security and privacy concerns of performing an evaluation and management service by telephone and the availability of in person appointments. I also discussed with the patient that there may be a patient responsible charge related to this service. The patient expressed understanding and agreed to proceed.   I discussed the assessment and treatment plan with the patient. The patient was provided an opportunity to ask questions and all were answered. The patient agreed with the plan and demonstrated an understanding of the instructions.   The patient was advised to call back or seek an in-person evaluation if the symptoms worsen or if the condition fails to improve as anticipated.  I provided 30 minutes of non-face-to-face time during this encounter.   [name], LCSW    THERAPIST PROGRESS NOTE  Session Time: [phone]  Participation Level: Active  Behavioral Response: NAAlertAnxious  Type of Therapy: Individual Therapy  Treatment Goals addressed: Anxiety  Interventions: CBT and Supportive  Summary: Manuel Tucker is a 50 y.o. male who presents with decreased intensity of anxiety symptoms. Client reported feelings of improvement with coping with anxiety symptoms in daily life. Client was able to identify skills used to address both feelings and thoughts related to anxiety with distress tolerance skills including distraction and mindfulness skills such as paced breathing.   Suicidal/Homicidal: No  Therapist Response: Clinician checked in with client, assessed for SI/HI/psychosis and overall level of functioning. Clinician inquired about thoughts/feelings related to health concerns and skills used to address.  Clinician agrees with client progress in consistent use of mindfulness skills and practicing to build mastery of skills regularly.  Plan: Return again in 4 weeks.  Diagnosis: Axis I: Generalized Anxiety Disorder      [name], LCSW 11/23/2020

## 2020-12-21 ENCOUNTER — Ambulatory Visit (HOSPITAL_COMMUNITY): Payer: Medicare Other | Admitting: Licensed Clinical Social Worker

## 2020-12-22 ENCOUNTER — Other Ambulatory Visit (HOSPITAL_COMMUNITY): Payer: Self-pay | Admitting: Psychiatry

## 2020-12-29 ENCOUNTER — Other Ambulatory Visit: Payer: Self-pay

## 2020-12-29 ENCOUNTER — Ambulatory Visit (INDEPENDENT_AMBULATORY_CARE_PROVIDER_SITE_OTHER): Payer: Medicare Other | Admitting: Licensed Clinical Social Worker

## 2020-12-29 DIAGNOSIS — F411 Generalized anxiety disorder: Secondary | ICD-10-CM | POA: Diagnosis not present

## 2020-12-29 NOTE — Progress Notes (Signed)
Virtual Visit via Telephone Note  I connected with Manuel Tucker on 12/29/20 at 11:00 AM EST by telephone and verified that I am speaking with the correct person using two identifiers.  Location: Patient: home Provider: office   I discussed the limitations, risks, security and privacy concerns of performing an evaluation and management service by telephone and the availability of in person appointments. I also discussed with the patient that there may be a patient responsible charge related to this service. The patient expressed understanding and agreed to proceed.  I discussed the assessment and treatment plan with the patient. The patient was provided an opportunity to ask questions and all were answered. The patient agreed with the plan and demonstrated an understanding of the instructions.   The patient was advised to call back or seek an in-person evaluation if the symptoms worsen or if the condition fails to improve as anticipated.  I provided 30 minutes of non-face-to-face time during this encounter.   Manuel Ditty, LCSW    THERAPIST PROGRESS NOTE  Session Time: [phone]  Participation Level: Active  Behavioral Response: NAAlertEuthymic  Type of Therapy: Individual Therapy  Treatment Goals addressed: Coping  Interventions: CBT and Supportive  Summary: Manuel Tucker is a 50 y.o. male who presents with generalized anxiety, improved. Client provided examples of distraction skills used in previous weeks to address increased anxiety levels. Clinician showed progress toward goals AEB challenging distorted thinking previously leading to increased anxiety. Clinician praised client use of socratic questioning and fact vs feeling questions to manage healthy thinking. Clinician assessed for SI/HI/psychosis, which client denied. Client reported overall good sleep/appetite and coping skills planned if there is increased stress over the holidays, though notes he planned well this  year to avoid unnecessary stress related to finances around the holidays.  Suicidal/Homicidal: No   Plan: Return again in 4-6 weeks.  Diagnosis: Axis I: Generalized Anxiety Disorder       Manuel Ditty, LCSW 12/29/2020

## 2021-01-03 DIAGNOSIS — M19071 Primary osteoarthritis, right ankle and foot: Secondary | ICD-10-CM | POA: Insufficient documentation

## 2021-01-03 DIAGNOSIS — F32A Depression, unspecified: Secondary | ICD-10-CM | POA: Insufficient documentation

## 2021-01-10 ENCOUNTER — Other Ambulatory Visit: Payer: Self-pay | Admitting: Neurology

## 2021-01-25 ENCOUNTER — Encounter: Payer: Self-pay | Admitting: Neurology

## 2021-01-25 ENCOUNTER — Ambulatory Visit (INDEPENDENT_AMBULATORY_CARE_PROVIDER_SITE_OTHER): Payer: Medicare Other | Admitting: Neurology

## 2021-01-25 ENCOUNTER — Other Ambulatory Visit: Payer: Self-pay

## 2021-01-25 VITALS — BP 109/74 | HR 63 | Ht 68.0 in | Wt 201.8 lb

## 2021-01-25 DIAGNOSIS — G40009 Localization-related (focal) (partial) idiopathic epilepsy and epileptic syndromes with seizures of localized onset, not intractable, without status epilepticus: Secondary | ICD-10-CM

## 2021-01-25 MED ORDER — DIVALPROEX SODIUM 500 MG PO DR TAB: DELAYED_RELEASE_TABLET | ORAL | 3 refills | Status: DC
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
## 2021-02-01 ENCOUNTER — Ambulatory Visit (INDEPENDENT_AMBULATORY_CARE_PROVIDER_SITE_OTHER): Payer: Medicare Other | Admitting: Licensed Clinical Social Worker

## 2021-02-01 ENCOUNTER — Other Ambulatory Visit: Payer: Self-pay

## 2021-02-01 DIAGNOSIS — F411 Generalized anxiety disorder: Secondary | ICD-10-CM

## 2021-02-01 NOTE — Progress Notes (Signed)
Virtual Visit via Telephone Note  I connected with Manuel Tucker on 02/01/21 at 11:00 AM EST by telephone and verified that I am speaking with the correct person using two identifiers.  Location: Patient: home Provider: office   I discussed the limitations, risks, security and privacy concerns of performing an evaluation and management service by telephone and the availability of in person appointments. I also discussed with the patient that there may be a patient responsible charge related to this service. The patient expressed understanding and agreed to proceed.  I discussed the assessment and treatment plan with the patient. The patient was provided an opportunity to ask questions and all were answered. The patient agreed with the plan and demonstrated an understanding of the instructions.   The patient was advised to call back or seek an in-person evaluation if the symptoms worsen or if the condition fails to improve as anticipated.  I provided 30 minutes of non-face-to-face time during this encounter.   Olegario Messier, LCSW    THERAPIST PROGRESS NOTE  Session Time: 11-1132am  Participation Level: Active  Behavioral Response: NAAlertEuthymic  Type of Therapy: Individual Therapy  Treatment Goals addressed: Anxiety  Interventions: CBT and Supportive  Summary: Manuel Tucker is a 51 y.o. male who presents with generalized anxiety disorder. Client processed coping with stressors over the holidays with minimal anxiety. Client processed mother medical diagnosis and distraction and problem solving skills used, showing progress toward goal AEB 0 panic attacks since last session.   Suicidal/Homicidal: No  Therapist Response: Clinician checked in with client, assessed for SI/HI/psychosis and overall level of functioning. Clinician inquired about number of panic attacks and skills used to address anxiety. Clinician and client reviewed skills which could be used while visiting his  mother due to health concerns which previously caused increased anxiety.  Plan: Return again in 3-4 weeks.  Diagnosis: Axis I: Generalized Anxiety Disorder       Olegario Messier, LCSW 02/01/2021

## 2021-02-15 ENCOUNTER — Encounter (HOSPITAL_COMMUNITY): Payer: Self-pay | Admitting: Psychiatry

## 2021-02-15 ENCOUNTER — Other Ambulatory Visit: Payer: Self-pay

## 2021-02-15 ENCOUNTER — Telehealth (HOSPITAL_BASED_OUTPATIENT_CLINIC_OR_DEPARTMENT_OTHER): Payer: Medicare Other | Admitting: Psychiatry

## 2021-02-15 VITALS — Wt 201.0 lb

## 2021-02-15 DIAGNOSIS — F411 Generalized anxiety disorder: Secondary | ICD-10-CM | POA: Diagnosis not present

## 2021-02-15 DIAGNOSIS — F319 Bipolar disorder, unspecified: Secondary | ICD-10-CM | POA: Diagnosis not present

## 2021-02-15 DIAGNOSIS — F410 Panic disorder [episodic paroxysmal anxiety] without agoraphobia: Secondary | ICD-10-CM

## 2021-02-15 MED ORDER — HYDROXYZINE PAMOATE 50 MG PO CAPS: 50.0000 mg | ORAL_CAPSULE | Freq: Four times a day (QID) | ORAL | 2 refills | Status: DC | PRN

## 2021-02-15 MED ORDER — QUETIAPINE FUMARATE 300 MG PO TABS: 300.0000 mg | ORAL_TABLET | Freq: Every day | ORAL | 2 refills | Status: DC

## 2021-02-15 MED ORDER — VENLAFAXINE HCL ER 75 MG PO CP24: 75.0000 mg | ORAL_CAPSULE | Freq: Two times a day (BID) | ORAL | 2 refills | Status: DC

## 2021-02-15 MED ORDER — ESCITALOPRAM OXALATE 20 MG PO TABS: 20.0000 mg | ORAL_TABLET | Freq: Every day | ORAL | 2 refills | Status: DC

## 2021-02-15 MED ORDER — QUETIAPINE FUMARATE 25 MG PO TABS: 25.0000 mg | ORAL_TABLET | Freq: Every day | ORAL | 2 refills | Status: DC
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
## 2021-02-22 ENCOUNTER — Ambulatory Visit (INDEPENDENT_AMBULATORY_CARE_PROVIDER_SITE_OTHER): Payer: Medicare Other | Admitting: Licensed Clinical Social Worker

## 2021-02-22 ENCOUNTER — Other Ambulatory Visit: Payer: Self-pay

## 2021-02-22 DIAGNOSIS — F411 Generalized anxiety disorder: Secondary | ICD-10-CM

## 2021-02-22 NOTE — Progress Notes (Signed)
Virtual Visit via Telephone Note  I connected with Manuel Tucker on 02/22/21 at 11:00 AM EST by telephone and verified that I am speaking with the correct person using two identifiers.  Location: Patient: home Provider: office   I discussed the limitations, risks, security and privacy concerns of performing an evaluation and management service by telephone and the availability of in person appointments. I also discussed with the patient that there may be a patient responsible charge related to this service. The patient expressed understanding and agreed to proceed.  I discussed the assessment and treatment plan with the patient. The patient was provided an opportunity to ask questions and all were answered. The patient agreed with the plan and demonstrated an understanding of the instructions.   The patient was advised to call back or seek an in-person evaluation if the symptoms worsen or if the condition fails to improve as anticipated.  I provided 30 minutes of non-face-to-face time during this encounter.   Olegario Messier, LCSW    THERAPIST PROGRESS NOTE  Session Time: 11-1128  Participation Level: Active  Behavioral Response: NAAlertEuthymic  Type of Therapy: Individual Therapy  Treatment Goals addressed: Anxiety, Coping, and Diagnosis: increase use of distress tolerance skills to at least 1 time daily at least 4 days weekly to decrease intensity of anxiety.  Interventions: CBT and Supportive  Summary: Manuel Tucker is a 51 y.o. male who presents with generalized anxiety disorder, improved. Client explained skills used to address anxiety on trip to visit mother. Client showed progress toward goal AEB utilizing distraction skills and comparisons to manage feelings of severity of mother's condition. Client reported improved mood and decreased anxiety after seeing mother in person and was able to effectively communicate experience with family members. Client reported instead  of sitting in ruminating thoughts he created action plan with mother to ensure medications were taken as prescribed. Client voiced understanding of transitioning to new provider and agreed to meet with new provider in approximately 1 month. Client will contact office before then if support is needed.   Suicidal/Homicidal: No  Therapist Response: Clinician met with client via phone, assessed for SI/HI/psychosis and overall level of functioning. Clinician and client discussed client's adding new activities into schedule periodically to avoid boredom and isolation. Clinician praised client use of distress tolerance skills, assertive communication and problem solving with family. Clinician discussed transition to new provider and termination of current therapeutic relationship. Clinician allowed time for any comments or concerns to be voiced.  Plan: Return again in 4 weeks with new provider.  Diagnosis: Axis I: Generalized Anxiety Disorder     Olegario Messier, LCSW 02/22/2021
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
## 2021-02-23 ENCOUNTER — Encounter (HOSPITAL_BASED_OUTPATIENT_CLINIC_OR_DEPARTMENT_OTHER): Payer: Self-pay

## 2021-02-23 ENCOUNTER — Other Ambulatory Visit: Payer: Self-pay

## 2021-02-23 ENCOUNTER — Emergency Department (HOSPITAL_BASED_OUTPATIENT_CLINIC_OR_DEPARTMENT_OTHER): Payer: Medicare Other | Admitting: Radiology

## 2021-02-23 ENCOUNTER — Emergency Department (HOSPITAL_BASED_OUTPATIENT_CLINIC_OR_DEPARTMENT_OTHER)
Admission: EM | Admit: 2021-02-23 | Discharge: 2021-02-23 | Disposition: A | Discharge status: Home / Self Care | Payer: Medicare Other | Attending: Emergency Medicine | Admitting: Emergency Medicine

## 2021-02-23 DIAGNOSIS — Z7982 Long term (current) use of aspirin: Secondary | ICD-10-CM | POA: Diagnosis not present

## 2021-02-23 DIAGNOSIS — M545 Low back pain, unspecified: Secondary | ICD-10-CM | POA: Diagnosis not present

## 2021-02-23 DIAGNOSIS — R269 Unspecified abnormalities of gait and mobility: Secondary | ICD-10-CM | POA: Diagnosis not present

## 2021-02-23 DIAGNOSIS — M549 Dorsalgia, unspecified: Secondary | ICD-10-CM | POA: Diagnosis present

## 2021-02-23 MED ORDER — KETOROLAC TROMETHAMINE 30 MG/ML IJ SOLN: 30.0000 mg | Freq: Once | INTRAMUSCULAR | Status: DC

## 2021-02-23 MED ORDER — DEXAMETHASONE SODIUM PHOSPHATE 10 MG/ML IJ SOLN
10.0000 mg | Freq: Once | INTRAMUSCULAR | Status: AC
  Administered 2021-02-23: 15:00:00 10 mg via INTRAMUSCULAR
  Filled 2021-02-23: qty 1

## 2021-02-23 MED ORDER — HYDROCODONE-ACETAMINOPHEN 5-325 MG PO TABS
1.0000 | ORAL_TABLET | Freq: Once | ORAL | Status: AC
  Administered 2021-02-23: 14:00:00 1 via ORAL
  Filled 2021-02-23: qty 1

## 2021-02-23 MED ORDER — KETOROLAC TROMETHAMINE 30 MG/ML IJ SOLN
60.0000 mg | Freq: Once | INTRAMUSCULAR | Status: DC
  Filled 2021-02-23: qty 2

## 2021-02-23 MED ORDER — KETOROLAC TROMETHAMINE 60 MG/2ML IM SOLN
60.0000 mg | Freq: Once | INTRAMUSCULAR | Status: AC
  Administered 2021-02-23: 15:00:00 60 mg via INTRAMUSCULAR
  Filled 2021-02-23: qty 2

## 2021-02-23 NOTE — Discharge Instructions (Addendum)
Do your best to follow-up with your primary care provider until you are able to get in with the pain clinic.  You were given a steroid shot and Toradol as requested today.  Continue to take your muscle relaxants and ibuprofen or meloxicam.  Do not take both.  Information about back pain is attached to these papers and should help you with some at home care.

## 2021-02-23 NOTE — ED Notes (Signed)
Pt teaching provided on medications that may cause drowsiness. Pt instructed not to drive or operate heavy machinery while taking the prescribed medication. Pt verbalized understanding.  ? ?Pt provided discharge instructions and prescription information. Pt was given the opportunity to ask questions and questions were answered. Discharge signature not obtained in the setting of the COVID-19 pandemic in order to reduce high touch surfaces.  ? ?

## 2021-02-23 NOTE — ED Triage Notes (Signed)
Pt c/o lower back pain after a coughing episode. Pt states he heard his back go pop followed by sudden pain. Pt has hx of fusion of L4-S1. Pt also has neuro stimulator in place.

## 2021-02-23 NOTE — ED Provider Notes (Signed)
[facility] Provider Note   CSN: [id number] Arrival date & time: 02/23/21  1124     History  Chief Complaint  Patient presents with   Back Pain    Manuel Tucker is a 51 y.o. male with a past medical history of back pain requiring placement of a spinal cord stimulator in 2020 presenting today with back pain.  Reports that on Sunday he was laughing with his family and began to have low back suddenly.  Has had intolerable pain ever since.  Has been taking meloxicam, Flexeril and Tylenol without relief.  He is unable to get in with his pain provider until February.  Denies any bowel/bladder dysfunction or numbness.  Increased pain with ambulation however is able to ambulate.   Back Pain     Home Medications Prior to Admission medications   Medication Sig Start Date End Date Taking? Authorizing Provider  acetaminophen (TYLENOL) 500 MG tablet Take 1 tablet (500 mg total) by mouth every 6 (six) hours as needed. 04/25/19   Fawze, Mina A, PA-C  amLODipine (NORVASC) 10 MG tablet Take 10 mg by mouth daily.  10/02/18   [provider]  aspirin 81 MG EC tablet Take 81 mg by mouth daily. 01/29/20   [provider]  atorvastatin (LIPITOR) 20 MG tablet Take 20 mg by mouth daily. 01/10/21   [provider]  carvedilol (COREG) 3.125 MG tablet Take by mouth. 01/29/20   [provider]  cyclobenzaprine (FLEXERIL) 10 MG tablet Take 10 mg by mouth daily as needed for muscle spasms.  10/02/18   [provider]  divalproex (DEPAKOTE) 500 MG DR tablet Take 2 tablets twice a day 01/25/21   Cameron Sprang, MD  escitalopram (LEXAPRO) 20 MG tablet Take 1 tablet (20 mg total) by mouth daily. 02/15/21 02/15/22  Arfeen, Arlyce Harman, MD  ferrous sulfate 324 MG TBEC Take 324 mg by mouth.    [provider]  hydrOXYzine (VISTARIL) 50 MG capsule Take 1 capsule (50 mg total) by mouth 4 (four) times daily as needed for anxiety. 02/15/21   Arfeen,  Arlyce Harman, MD  lisinopril (ZESTRIL) 20 MG tablet Take 20 mg by mouth daily. 05/07/19   Chesley Noon, MD  meloxicam (MOBIC) 15 MG tablet Take 1 tablet by mouth daily. 06/01/20   [provider]  QUEtiapine (SEROQUEL) 25 MG tablet Take 1 tablet (25 mg total) by mouth daily. 02/15/21 02/15/22  Arfeen, Arlyce Harman, MD  QUEtiapine (SEROQUEL) 300 MG tablet Take 1 tablet (300 mg total) by mouth at bedtime. 02/15/21 02/15/22  Arfeen, Arlyce Harman, MD  venlafaxine XR (EFFEXOR-XR) 75 MG 24 hr capsule Take 1 capsule (75 mg total) by mouth 2 (two) times daily. 02/15/21 02/15/22  Arfeen, Arlyce Harman, MD  vitamin B-12 (CYANOCOBALAMIN) 500 MCG tablet Take 500 mcg by mouth daily.    [provider]      Allergies    Banana and Celecoxib    Review of Systems   Review of Systems  Musculoskeletal:  Positive for back pain.  As per HPI  Physical Exam Updated Vital Signs BP (!) 147/73 (BP Location: Right Arm)    Pulse 66    Temp 98.2 F (36.8 C) (Oral)    Resp 20    Ht 5\' 8"  (1.727 m)    Wt 91.6 kg    SpO2 100%    BMI 30.71 kg/m  Physical Exam Vitals and nursing note reviewed.  Constitutional:  Appearance: Normal appearance. He is not ill-appearing.  HENT:     Head: Normocephalic and atraumatic.  Eyes:     General: No scleral icterus.    Conjunctiva/sclera: Conjunctivae normal.  Pulmonary:     Effort: Pulmonary effort is normal. No respiratory distress.  Musculoskeletal:        General: Tenderness (Midline to L5) present.  Skin:    General: Skin is warm and dry.     Findings: No bruising or rash.  Neurological:     Mental Status: He is alert.     Coordination: Coordination normal.     Gait: Gait abnormal (Minor limp).  Psychiatric:        Mood and Affect: Mood normal.    ED Results / Procedures / Treatments   Labs (all labs ordered are listed, but only abnormal results are displayed) Labs Reviewed - No data to display  EKG None  Radiology No results found.  Procedures Procedures     Medications Ordered in ED Medications - No data to display  ED Course/ Medical Decision Making/ A&P                           Medical Decision Making Amount and/or Complexity of Data Reviewed Radiology: ordered.  Risk Prescription drug management.   51 year old with a past medical history of back pain requiring stimulator presenting with back pain.  Reports that it started acutely after laughing.  Contacted his spine and pain management who are unable to get him in until February.   Patient does not have any red flags of back pain such as saddle anesthesia, difficulty ambulating, bowel/bladder dysfunction or history of malignancy.  Denies any IVDU, fevers or chills.  On physical exam, tenderness is midline, no paraspinal tenderness.  Imaging was pursued and lumbar spine x-ray negative.  Stimulator equipment appears to be in place.  Patient reports that this happened a few years ago and that he was given a Toradol shot that made him feel better.  Happy to give him a Toradol shot today.  Also will be given Decadron shot.  Discharge at this time.  Final Clinical Impression(s) / ED Diagnoses Final diagnoses:  Acute midline low back pain without sciatica    Rx / DC Orders Results and diagnoses were explained to the patient. Return precautions discussed in full. Patient had no additional questions and expressed complete understanding.   This chart was dictated using voice recognition software.  Despite best efforts to proofread,  errors can occur which can change the documentation meaning.    Darliss Ridgel 02/23/21 1529    Tegeler, Gwenyth Allegra, MD 02/23/21 [phone]

## 2021-03-11 ENCOUNTER — Other Ambulatory Visit: Payer: Self-pay | Admitting: Pain Medicine

## 2021-03-11 ENCOUNTER — Other Ambulatory Visit (HOSPITAL_COMMUNITY): Payer: Self-pay | Admitting: Pain Medicine

## 2021-03-11 DIAGNOSIS — M545 Low back pain, unspecified: Secondary | ICD-10-CM

## 2021-03-22 ENCOUNTER — Other Ambulatory Visit: Payer: Self-pay

## 2021-03-22 ENCOUNTER — Ambulatory Visit (HOSPITAL_COMMUNITY)
Admission: RE | Admit: 2021-03-22 | Discharge: 2021-03-22 | Disposition: A | Discharge status: Home / Self Care | Payer: Medicare Other | Source: Ambulatory Visit | Attending: Pain Medicine | Admitting: Pain Medicine

## 2021-03-22 DIAGNOSIS — M545 Low back pain, unspecified: Secondary | ICD-10-CM | POA: Diagnosis present

## 2021-03-25 ENCOUNTER — Ambulatory Visit (INDEPENDENT_AMBULATORY_CARE_PROVIDER_SITE_OTHER): Payer: Medicare Other | Admitting: Licensed Clinical Social Worker

## 2021-03-25 DIAGNOSIS — F319 Bipolar disorder, unspecified: Secondary | ICD-10-CM

## 2021-03-25 DIAGNOSIS — F411 Generalized anxiety disorder: Secondary | ICD-10-CM

## 2021-03-25 DIAGNOSIS — F410 Panic disorder [episodic paroxysmal anxiety] without agoraphobia: Secondary | ICD-10-CM

## 2021-03-25 NOTE — Progress Notes (Signed)
Virtual Visit via Telephone Note  I connected with [name] on 03/25/21 at 10:00 AM EST by telephone and verified that I am speaking with the correct person using two identifiers.  Location: Patient: home Provider: home office   I discussed the limitations, risks, security and privacy concerns of performing an evaluation and management service by telephone and the availability of in person appointments. I also discussed with the patient that there may be a patient responsible charge related to this service. The patient expressed understanding and agreed to proceed.   I discussed the assessment and treatment plan with the patient. The patient was provided an opportunity to ask questions and all were answered. The patient agreed with the plan and demonstrated an understanding of the instructions.   The patient was advised to call back or seek an in-person evaluation if the symptoms worsen or if the condition fails to improve as anticipated.  I provided 50 minutes of non-face-to-face time during this encounter.  THERAPIST PROGRESS NOTE  Session Time: 10:00 AM to 10:50 AM  Participation Level: Active  Behavioral Response: CasualAlertEuthymic  Type of Therapy: Individual Therapy  Treatment Goals addressed: Anxiety, Coping, and Diagnosis: increase use of distress tolerance skills to at least 1 time daily at least 4 days weekly to decrease intensity of anxiety.  ProgressTowards Goals: Progressing-making very good progress with different coping strategies very helpful for symptoms including such things as schedule, doing things he enjoys going to the gym, probably vagal type activities to help decrease stress and therapist providing more insight to relaxation understanding of panic as misfiring of fight flight to also help with patient in this process of progress  Interventions: Review of history as patient is new to this therapist, CBT, strength-based supportive solution focused,  coping Summary: Manuel Tucker is a 51 y.o. male who presents with just took daughter to interview with a psychology professor. [employer] professor. She was nervous but once started she was fine.  This noted note saying she is doing well in school and that something to feel proud of for her and him.  Patient reviewed history and is diagnosed with bipolar, generalized anxiety disorder and panic. Right now it has been better still ups and downs another death in the family upset him a bit not the way it used to. Four times in past 2 years. You become numb to it. It affects and bother him but not like at first where you break down oh my God can't believe it happened hits you hard, this time it happened let's move on. Not sure good but since it happened 4 times feels done with it. First ex-wife died of cancer, then close friend, then ex-wife sister died of cancer, ex mother-in-law just passed. Thinks about how impacts his son in [employer] he has shut down emotions understands in [employer] way of being. Talks to him about it sees it affects him three of them on his mom's side. Tell him ok mourn but if need to hold strong that is ok too. They will talk about things and then hop on the ex-box playing and laughing it helps attitude changes playing the game in ways that get laugh and they patient said did his job. Helps him don't have to worry about him when son gets to point he is ok.  Reviewed work with previous therapist and he relates she would give him ideas ways to control symptoms when started to feel anxious. If have an attack ground himself helps his mind off  the attack (senses), use ice packs on neck or forehead, his attention would go to the nerve and the cold. When do that all is feeling is the cold of ice pack. Body reacts to that and not focusing on the attack. It has been awhile since had a panic attack. Lack of sleep impacts it. But otherwise come on out of the blue. Could be watching TV it happens. No trigger only  one is a lack of sleep. Patient and wife are together in the same home all day. Not out there in stressful situations that is why it makes it weird that he has panic.  Patient says in the past were really bad could have every day have when he was driving. Used to worry about everything. Now different being home all day have more control. Try to schedule everything so everything gets done not everything at one time. Doesn't stress because able to get done. Takes daughter to school gets up early have that time in morning when can focus. In the afternoon don't focus on calling people better do in the morning. Monday Wednesday Friday goes to the gym can't now because hurt back. Good escape not at home and knows takes care of body. When get home thinks about how physically and mentally doing something that is good for them. Movie pass goes twice a month gets out of the house. Therapist noted he is in a good place with coping have had this 2011 so dealing with this for awhile over the years learned a lot of ways to deal some things have to change. Also calls family members mother and brother. That helps too. Patient is stable with bipolar. Patient thinks doing things the way he does allocate time for different times really helps when that wasn't in place when mood swings.  Explored how often patient wants to do therapy with other therapist was down to once a month patient still feels therapy is helpful as things come up to work through and therapist added expressing feelings in therapy labeling feelings accurately helps with coping process as well as building insight.  Therapist reviewed symptoms, facilitated expression of thoughts and feeling this is her first session so reviewed history as well as treatment goals.  Therapist noted patient implementing a good plan for his physical and mental health patient noting he is known this since 2011 and has time to develop this.  The patient being on a schedule is helpful for  functioning in general helpful for his mental health has time to get things done so decreases his anxiety.  Therapist noted key strategy for anxiety as relaxation so that when is not triggered when he is able better to implement strategies by having practiced them.  Noted other strategies such as doing things he enjoys going to the movies twice a month, normally going to the gym and therapist said this is one of the best things for his mental health helps to activate neurotransmitters for more positive mood, as well as feeling good about ourselves and also taking care of her physical health.  Therapist noted in general strategy of doing things we enjoy, connection to others as well as purpose also are good programs for in particular to depression but in general for mental health.  Patient does this through calling family members he connects with others.  Discussed panic attacks that come out of the blue and things that work including grounding by looking at senses, patient puts cold packs on his neck and  forehead and therapist noted probably vagal type of activities help to deactivate [place] introduced a new 1 and think would be helpful to introduce more would be helpful for patient.  Therapist provided basic education on panic that its misfiring of fighter flight so we have to realize symptoms or not can harm [place] so we the catastrophize, positive talk help to distraction helps also in interventions with her body which is the source of anxiety will be helpful.  Talked about recent loss and explored whether patient being more numb is better therapist noting in her experience having "tougher skin" helps this with life is bad things happen so sees the positive but also when needed to work through feelings as this is part of grief as well noted patient does talk about it with his son intention is to help him but also helps patient.  Therapist provided active listening open questions supportive  interventions.  Suicidal/Homicidal: No  Plan: Return again in 4 weeks.2.  Utilize sessions to process feelings help patient with coping related to issues come up such as panic  Diagnosis: No diagnosis found.  Generalized anxiety disorder, bipolar disorder, and panic attack  Collaboration of Care: Primary Care Provider AEB Reviewed note by Dr. Adele Schilder 02/15/21 and Other Reviewed note of previous therapist Drue Flirt on 02/22/21 as well as assessment on 09/30/20 by this therapist  Patient/Guardian was advised Release of Information must be obtained prior to any record release in order to collaborate their care with an outside provider. Patient/Guardian was advised if they have not already done so to contact the registration department to sign all necessary forms in order for [place] to release information regarding their care.   Consent: Patient/Guardian gives verbal consent for treatment and assignment of benefits for services provided during this visit. Patient/Guardian expressed understanding and agreed to proceed.   Cordella Register, LCSW 03/25/2021

## 2021-04-21 ENCOUNTER — Ambulatory Visit (INDEPENDENT_AMBULATORY_CARE_PROVIDER_SITE_OTHER): Payer: Medicare Other | Admitting: Licensed Clinical Social Worker

## 2021-04-21 DIAGNOSIS — F410 Panic disorder [episodic paroxysmal anxiety] without agoraphobia: Secondary | ICD-10-CM

## 2021-04-21 DIAGNOSIS — F411 Generalized anxiety disorder: Secondary | ICD-10-CM | POA: Diagnosis not present

## 2021-04-21 DIAGNOSIS — F319 Bipolar disorder, unspecified: Secondary | ICD-10-CM | POA: Diagnosis not present

## 2021-04-21 NOTE — Progress Notes (Signed)
Virtual Visit via Telephone Note ? ?I connected with Manuel Tucker on 04/21/21 at  9:00 AM EDT by telephone and verified that I am speaking with the correct person using two identifiers. ? ?Location: ?Patient: home ?Provider: home office ?  ?I discussed the limitations, risks, security and privacy concerns of performing an evaluation and management service by telephone and the availability of in person appointments. I also discussed with the patient that there may be a patient responsible charge related to this service. The patient expressed understanding and agreed to proceed. ? ?I discussed the assessment and treatment plan with the patient. The patient was provided an opportunity to ask questions and all were answered. The patient agreed with the plan and demonstrated an understanding of the instructions. ?  ?The patient was advised to call back or seek an in-person evaluation if the symptoms worsen or if the condition fails to improve as anticipated. ? ?I provided 50 minutes of non-face-to-face time during this encounter. ? ?THERAPIST PROGRESS NOTE ? ?Session Time: 9:00 AM to 9:50 AM ? ?Participation Level: Active ? ?Behavioral Response: CasualAlert mixture of dysthymia and euthymia and talking about his memories and anniversary day of mother-in-law's death ? ?Type of Therapy: Individual Therapy ? ?Treatment Goals addressed: Anxiety, Coping, and Diagnosis: increase use of distress tolerance skills to at least 1 time daily at least 4 days weekly to decrease intensity of anxiety. ? ?ProgressTowards Goals: Progressing ? ?Interventions: Solution Focused, Strength-based, Supportive, and Other: grief, coping ? ?Summary: Manuel Tucker is a 51 y.o. male who presents with fifth anniversary of mother-in-law passing wife and her really close. Patient says want to keep wife occupied so doesn't sit on her. Yesterday got flowers and will put by the picture of her. Lacinda Axon something that wife loves that his mother-in-law  cooked. She loved coffee when wife wakes up patient will say to her lets have our coffee. Before school take daughter to eat pancakes.  There when daughter born. There when married she was there for so much. Stretched out activity during the day anytime down some food is coming her way. When wife wakes up she is going to be so depressed. Was staying up late last night. Knows she is not going to wake up early, cry, patient will tell her when she is ready that mother-in-law is having coffee with them. Every anniversary camera starts acting up never fails. Doesn't believe in super natural but this always happens. Patient talked about memories she was great, Lorre Nick, loved coffee. Kept plates full. When met wife son two years old, [name],  pick up every weekend would take him over to her place and she would play with him, tell them to go and would look after him. She was crazy and quirky. She went to races every day. [employer] every weekend there. If won something show up with something with daughter. Patient will wait for his wife to get out of funk bring up memories and start laughing. Funny memories. Her brother will Facetime from [employer]. Make it special. He brings up funny things. Daughter wanted to keep wife's mind off but agrees with patient to make today a remembrance of grandmother not shut out and change conversation remind her of the good times, enjoy it with the memory. There is a blanket belonged to mom pull it out for this day. Plant from her and a blanket. Cook with her pot. Patient's mom still there patient is baby always with her everyday talk on the phone  don't know how react when he loses her. Knows every year do the same thing that he is doing with his mother-in-law. Things that are for her will do those things things they did together.  Wife didn't get the opportunity to learn from his mom a lot of these things because this was before they were together though still talk and have close  relationship. Haven't seen mom since moved from [place], she has health issues would love to come down. She wants to plan doctor's appointments so can have a couple weeks to come down and visit. Patient at end shares talking about her and memories makes him feel better.   ? ?Today is anniversary of mother in [place] passing so encouraged patient to share memories of her to tell therapist and describe her more so therapist would have an idea what she was like.  Discussed rituals as helpful and provided positive feedback for patient planning to do that today.  Noted it recognizes them as somebody very important in her lives, still connected in part of her lives through memories, through rituals, through different items that bring back memories.  Things they have taught [place], relationships we have had memories we have make her lies richer in more meaningful.  Noted bringing humor to memories helps with the grief process able to enjoy some of these memories, along with other ways to enjoy these memories for example through food.  Assess helpful for patient to talk about his plans as well as memories as intervention for him with grief.  Therapist provided active listening open questions supportive intervention ?Suicidal/Homicidal: No ? ?Plan: Return again in 4 weeks.2.Utilize sessions to process feelings help patient with coping related to issues come up such as panic.3.  Look at distress tolerance strategies as this is part of treatment plan ? ? ?Diagnosis: Generalized anxiety disorder, bipolar disorder, and panic attack ? ?Collaboration of Care: Other none needed ? ?Patient/Guardian was advised Release of Information must be obtained prior to any record release in order to collaborate their care with an outside provider. Patient/Guardian was advised if they have not already done so to contact the registration department to sign all necessary forms in order for [place] to release information regarding their care.  ? ?Consent:  Patient/Guardian gives verbal consent for treatment and assignment of benefits for services provided during this visit. Patient/Guardian expressed understanding and agreed to proceed.  ? ?Cordella Register, LCSW ?04/21/2021 ? ?

## 2021-04-29 ENCOUNTER — Telehealth (HOSPITAL_COMMUNITY): Payer: Self-pay

## 2021-04-29 ENCOUNTER — Other Ambulatory Visit (HOSPITAL_COMMUNITY): Payer: Self-pay | Admitting: Psychiatry

## 2021-04-29 DIAGNOSIS — F319 Bipolar disorder, unspecified: Secondary | ICD-10-CM

## 2021-04-29 DIAGNOSIS — F410 Panic disorder [episodic paroxysmal anxiety] without agoraphobia: Secondary | ICD-10-CM

## 2021-04-29 NOTE — Telephone Encounter (Signed)
Patient called requesting a refill on his Seroquel and requested a 90 day supply. Writer looked at his medication and it was sent in on 02/15/21 #30 w/2 refills. Pt takes 1 tab po qd 25mg  and 1 tab po qhs 300mg ; therefore pt should have enough until his appointment with Dr. on 05/17/21. Writer s/w patient and notified him of this and suggested he stick with what provider had sent in and request a 90 day supply at his appointment. Pt understood and agreed. Writer also s/w pharmacist and they stated that pt picked up his #30 and the 2 refills of his Quetiapine (Seroquel) 25mg  but only picked up #30 of the Quetiapine (Seroquel) 300mg  so pharmacist is filling the 2 refills. Pt had stated that he only has 1 pill left but didn't specify which dosage, so when writer was speaking with pharmacist he asked if writer would authorize 1 refill to get pt to his appointment ONLY IF NECESSARY. Writer did so ?

## 2021-05-13 ENCOUNTER — Other Ambulatory Visit (HOSPITAL_COMMUNITY): Payer: Self-pay | Admitting: Psychiatry

## 2021-05-13 DIAGNOSIS — F411 Generalized anxiety disorder: Secondary | ICD-10-CM

## 2021-05-17 ENCOUNTER — Telehealth (HOSPITAL_BASED_OUTPATIENT_CLINIC_OR_DEPARTMENT_OTHER): Payer: Medicare Other | Admitting: Psychiatry

## 2021-05-17 ENCOUNTER — Encounter (HOSPITAL_COMMUNITY): Payer: Self-pay | Admitting: Psychiatry

## 2021-05-17 DIAGNOSIS — F410 Panic disorder [episodic paroxysmal anxiety] without agoraphobia: Secondary | ICD-10-CM

## 2021-05-17 DIAGNOSIS — F319 Bipolar disorder, unspecified: Secondary | ICD-10-CM | POA: Diagnosis not present

## 2021-05-17 DIAGNOSIS — F411 Generalized anxiety disorder: Secondary | ICD-10-CM | POA: Diagnosis not present

## 2021-05-17 MED ORDER — QUETIAPINE FUMARATE 300 MG PO TABS: 300.0000 mg | ORAL_TABLET | Freq: Every day | ORAL | 2 refills | Status: DC

## 2021-05-17 MED ORDER — VENLAFAXINE HCL ER 75 MG PO CP24: 75.0000 mg | ORAL_CAPSULE | ORAL | 2 refills | Status: DC

## 2021-05-17 MED ORDER — ESCITALOPRAM OXALATE 20 MG PO TABS: 20.0000 mg | ORAL_TABLET | Freq: Every day | ORAL | 2 refills | Status: DC

## 2021-05-17 MED ORDER — QUETIAPINE FUMARATE 25 MG PO TABS: 25.0000 mg | ORAL_TABLET | Freq: Every day | ORAL | 2 refills | Status: DC

## 2021-05-17 MED ORDER — HYDROXYZINE PAMOATE 50 MG PO CAPS: 50.0000 mg | ORAL_CAPSULE | Freq: Three times a day (TID) | ORAL | 2 refills | Status: DC | PRN
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
## 2021-05-17 NOTE — Telephone Encounter (Signed)
Send new script with changes. Only taking once a day. ?

## 2021-05-26 ENCOUNTER — Ambulatory Visit (HOSPITAL_COMMUNITY): Payer: Medicare Other | Admitting: Licensed Clinical Social Worker

## 2021-07-06 ENCOUNTER — Ambulatory Visit (INDEPENDENT_AMBULATORY_CARE_PROVIDER_SITE_OTHER): Payer: Medicare Other | Admitting: Licensed Clinical Social Worker

## 2021-07-06 DIAGNOSIS — F411 Generalized anxiety disorder: Secondary | ICD-10-CM

## 2021-07-06 DIAGNOSIS — F319 Bipolar disorder, unspecified: Secondary | ICD-10-CM | POA: Diagnosis not present

## 2021-07-06 DIAGNOSIS — F410 Panic disorder [episodic paroxysmal anxiety] without agoraphobia: Secondary | ICD-10-CM

## 2021-07-06 NOTE — Progress Notes (Addendum)
Virtual Visit via Video Note  I connected with Albertina Parr on 07/06/21 at  3:00 PM EDT by a video enabled telemedicine application and verified that I am speaking with the correct person using two identifiers.  Location: Patient: home Provider: home office   I discussed the limitations of evaluation and management by telemedicine and the availability of in person appointments. The patient expressed understanding and agreed to proceed.  I discussed the assessment and treatment plan with the patient. The patient was provided an opportunity to ask questions and all were answered. The patient agreed with the plan and demonstrated an understanding of the instructions.   The patient was advised to call back or seek an in-person evaluation if the symptoms worsen or if the condition fails to improve as anticipated.  I provided 52 minutes of non-face-to-face time during this encounter.  THERAPIST PROGRESS NOTE  Session Time: 3:00 PM to 3:52 PM  Participation Level: Active  Behavioral Response: CasualAlertEuthymic  Type of Therapy: Individual Therapy  Treatment Goals addressed: Anxiety, Coping, and Diagnosis: increase use of distress tolerance skills to at least 1 time daily at least 4 days weekly to decrease intensity of anxiety.  ProgressTowards Goals: Progressing-patient using coping skills to help with improvement anxiety continues to use therapy to process feelings as well as using therapy for coping to help with making progress on goals  Interventions: Solution Focused, Strength-based, Supportive, and Other: Coping  Summary: Normand Damron is a 51 y.o. male who presents with injured back this past weekend. Went camping and hurt his back from lifting things in and out of the car. Loading things injured back, back pain last couple of days chronic back pain, says it will get better. Camping trip nice sense of peace. Hard to get away for them with five dogs. His dog is an emotional  support dog follows him everywhere. When antsy or going to have an anxiety attacks she knows put her paws up and put head under hand to pet her same with wife. Love her, [employer]. Showed therapist a picture and also wife's dog. Her dog also right there with her the whole time if not feeling well. They also have a cat an [place], 6 parakeets, Parrotlet-showed therapist a picture.  Patient was talking about camping trip therapist learn more about his family he has an older daughter she has 3 kids,.   another daughter [place], her daughter in [place], family outing get together once a week. Like watching movies outside. Youngest daughter lives with them. Move here youngest daughter excited about being around family. Moved August 2020. Wife choice daughter going through things. Patient said has a mental health program dealing with anxiety and depression 13 years as time goes on pick up and learn things. Amplify things help for example with camping to g bring the family, projector do things most things that people don't do. Go for a walk how many rabbits are there. Want to go more camping now that knows the daughter likes camping.  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 Especially doing things for kids gives him a purpose.  Noted positive activities patient engaging in including camping he is enjoying patient as well realizes and therapist noted to good for his mental health being outside connecting with family getting rid of distractions and patient was self adding amplifying experience helps with the experience to making  it even more enjoyable things he does such as shows movies outside, there are things he can add.  Therapist thought this was very insightful noting from her own experience and kayaking and amplify the experience good suggestion to really make an experience  more enjoyable and for filling.  Talked about emotional support dog and therapist noted from her experience one of the most helpful therapeutic interventions and and will there when you need them in the [place] there when they are with you all the time through tough and good experiences.  At times patient has that is good for his mental health including fishing.  Therapist noted from Dr.'s note patient has in place a program that helps maintain his mental health.  Noted this is effective way to manage mental health.  Patient relates he wants to keep things going well so plans to spend more time outdoors during the summer therapist agrees this good strategy.  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  Therapist gave patient space and support for patient to talk about thoughts and feelings.  Reviewed treatment plan patient gave consent to complete virtually noted good progress patient is making  Suicidal/Homicidal: No  Plan: Return again in 5 weeks.2.  Work on distress tolerance skills as needed, coping skills for anxiety  Diagnosis: Generalized anxiety disorder, bipolar disorder, and panic attack  Collaboration of Care: Other review of Dr. Lolly Mustache note  Patient/Guardian was advised Release of Information must be obtained prior to any record release in order to collaborate their care with an outside provider. Patient/Guardian was advised if they have not already done so to contact the registration department to sign all necessary forms in order for [place] to release information regarding their care.   Consent: Patient/Guardian gives verbal consent for treatment and assignment of benefits for services provided during this visit. Patient/Guardian expressed understanding and agreed to  proceed.   Coolidge Breeze, LCSW 07/06/2021

## 2021-07-06 NOTE — Plan of Care (Signed)
  Problem: Anxiety Goal: Ability to cope will improve Outcome: Progressing-patient relates anxiety under control Going well. Needs to keep doing the things he is doing.  Plans with the summer to spend more time outdoors that we will help him in continuing to do what he is doing now.

## 2021-08-11 ENCOUNTER — Ambulatory Visit (HOSPITAL_COMMUNITY): Payer: Medicare Other | Admitting: Licensed Clinical Social Worker

## 2021-08-17 ENCOUNTER — Encounter (HOSPITAL_COMMUNITY): Payer: Self-pay | Admitting: Psychiatry

## 2021-08-17 ENCOUNTER — Telehealth (HOSPITAL_BASED_OUTPATIENT_CLINIC_OR_DEPARTMENT_OTHER): Payer: Medicare Other | Admitting: Psychiatry

## 2021-08-17 ENCOUNTER — Telehealth (HOSPITAL_COMMUNITY): Payer: Medicare Other | Admitting: Psychiatry

## 2021-08-17 DIAGNOSIS — F410 Panic disorder [episodic paroxysmal anxiety] without agoraphobia: Secondary | ICD-10-CM | POA: Diagnosis not present

## 2021-08-17 DIAGNOSIS — F411 Generalized anxiety disorder: Secondary | ICD-10-CM

## 2021-08-17 DIAGNOSIS — F319 Bipolar disorder, unspecified: Secondary | ICD-10-CM

## 2021-08-17 MED ORDER — HYDROXYZINE PAMOATE 50 MG PO CAPS: 50.0000 mg | ORAL_CAPSULE | Freq: Two times a day (BID) | ORAL | 2 refills | Status: DC | PRN

## 2021-08-17 MED ORDER — ESCITALOPRAM OXALATE 20 MG PO TABS: 20.0000 mg | ORAL_TABLET | Freq: Every day | ORAL | 2 refills | Status: DC

## 2021-08-17 MED ORDER — VENLAFAXINE HCL ER 75 MG PO CP24: 75.0000 mg | ORAL_CAPSULE | ORAL | 2 refills | Status: DC

## 2021-08-17 MED ORDER — QUETIAPINE FUMARATE 300 MG PO TABS: 300.0000 mg | ORAL_TABLET | Freq: Every day | ORAL | 2 refills | Status: DC
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
## 2021-08-19 DIAGNOSIS — E559 Vitamin D deficiency, unspecified: Secondary | ICD-10-CM | POA: Insufficient documentation

## 2021-08-30 ENCOUNTER — Ambulatory Visit
Admission: RE | Admit: 2021-08-30 | Discharge: 2021-08-30 | Disposition: A | Discharge status: Home / Self Care | Payer: Medicare Other | Source: Ambulatory Visit | Attending: Nurse Practitioner | Admitting: Nurse Practitioner

## 2021-08-30 ENCOUNTER — Other Ambulatory Visit: Payer: Self-pay | Admitting: Nurse Practitioner

## 2021-08-30 DIAGNOSIS — M961 Postlaminectomy syndrome, not elsewhere classified: Secondary | ICD-10-CM

## 2021-09-06 ENCOUNTER — Other Ambulatory Visit: Payer: Self-pay

## 2021-09-06 ENCOUNTER — Encounter (HOSPITAL_BASED_OUTPATIENT_CLINIC_OR_DEPARTMENT_OTHER): Payer: Self-pay

## 2021-09-06 ENCOUNTER — Emergency Department (HOSPITAL_BASED_OUTPATIENT_CLINIC_OR_DEPARTMENT_OTHER): Payer: Medicare Other | Admitting: Radiology

## 2021-09-06 ENCOUNTER — Emergency Department (HOSPITAL_BASED_OUTPATIENT_CLINIC_OR_DEPARTMENT_OTHER): Payer: Medicare Other

## 2021-09-06 ENCOUNTER — Emergency Department (HOSPITAL_BASED_OUTPATIENT_CLINIC_OR_DEPARTMENT_OTHER)
Admission: EM | Admit: 2021-09-06 | Discharge: 2021-09-06 | Disposition: A | Discharge status: Home / Self Care | Payer: Medicare Other | Attending: Emergency Medicine | Admitting: Emergency Medicine

## 2021-09-06 DIAGNOSIS — Z79899 Other long term (current) drug therapy: Secondary | ICD-10-CM | POA: Diagnosis not present

## 2021-09-06 DIAGNOSIS — M791 Myalgia, unspecified site: Secondary | ICD-10-CM | POA: Diagnosis not present

## 2021-09-06 DIAGNOSIS — Y92410 Unspecified street and highway as the place of occurrence of the external cause: Secondary | ICD-10-CM | POA: Diagnosis not present

## 2021-09-06 DIAGNOSIS — Z7982 Long term (current) use of aspirin: Secondary | ICD-10-CM | POA: Diagnosis not present

## 2021-09-06 DIAGNOSIS — M542 Cervicalgia: Secondary | ICD-10-CM | POA: Diagnosis present

## 2021-09-06 DIAGNOSIS — M7918 Myalgia, other site: Secondary | ICD-10-CM

## 2021-09-06 MED ORDER — METHOCARBAMOL 500 MG PO TABS: 500.0000 mg | ORAL_TABLET | Freq: Four times a day (QID) | ORAL | 0 refills | Status: DC | PRN
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
## 2021-09-15 ENCOUNTER — Ambulatory Visit (INDEPENDENT_AMBULATORY_CARE_PROVIDER_SITE_OTHER): Payer: Medicare Other | Admitting: Licensed Clinical Social Worker

## 2021-09-15 DIAGNOSIS — F411 Generalized anxiety disorder: Secondary | ICD-10-CM | POA: Diagnosis not present

## 2021-09-15 DIAGNOSIS — F410 Panic disorder [episodic paroxysmal anxiety] without agoraphobia: Secondary | ICD-10-CM | POA: Diagnosis not present

## 2021-09-15 DIAGNOSIS — F319 Bipolar disorder, unspecified: Secondary | ICD-10-CM

## 2021-09-15 DIAGNOSIS — M542 Cervicalgia: Secondary | ICD-10-CM | POA: Insufficient documentation
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
## 2021-09-27 ENCOUNTER — Other Ambulatory Visit: Payer: Self-pay | Admitting: Orthopedic Surgery

## 2021-10-11 NOTE — Progress Notes (Signed)
Surgical Instructions    Your procedure is scheduled on Wednesday September 20.  Report to [facility] Main Entrance "A" at 12:30 P.M., then check in with the Admitting office.  Call this number if you have problems the morning of surgery:  [phone]   If you have any questions prior to your surgery date call [phone]: Open Monday-Friday 8am-4pm    Remember:  Do not eat after midnight the night before your surgery  You may drink clear liquids until 12:20PM the day of your surgery.   Clear liquids allowed are: Water, Non-Citrus Juices (without pulp), Carbonated Beverages, Clear Tea, Black Coffee ONLY (NO MILK, CREAM OR POWDERED CREAMER of any kind), and Gatorade   Patient Instructions  The night before surgery:  No food after midnight. ONLY clear liquids after midnight   The day of surgery (if you have diabetes): Drink ONE (1) 12 oz G2 given to you in your pre admission testing appointment by 12:30PM the morning of surgery. Drink in one sitting. Do not sip.  This drink was given to you during your hospital  pre-op appointment visit.  Nothing else to drink after completing the  12 oz bottle of G2.         If you have questions, please contact your surgeon's office.   Take these medicines the morning of surgery with A SIP OF WATER:   carvedilol (COREG)  divalproex (DEPAKOTE)  amLODipine (NORVASC)  atorvastatin (LIPITOR) escitalopram (LEXAPRO)  venlafaxine XR (EFFEXOR-XR)  hydrOXYzine (VISTARIL) if needed oxyCODONE (OXY IR/ROXICODONE) if needed acetaminophen (TYLENOL) if needed  As of today, STOP taking any Aspirin (unless otherwise instructed by your surgeon) Aleve, Naproxen, Ibuprofen, Motrin, Advil, Goody's, BC's, all herbal medications, fish oil, and all vitamins.    WHAT DO I DO ABOUT MY DIABETES MEDICATION?   Do not take oral diabetes medicines (pills) the morning of surgery. DO NOT TAKE Metformin (Glucophage) the day of surgery.     HOW TO MANAGE YOUR  DIABETES BEFORE AND AFTER SURGERY  Why is it important to control my blood sugar before and after surgery? Improving blood sugar levels before and after surgery helps healing and can limit problems. A way of improving blood sugar control is eating a healthy diet by:  Eating less sugar and carbohydrates  Increasing activity/exercise  Talking with your doctor about reaching your blood sugar goals High blood sugars (greater than 180 mg/dL) can raise your risk of infections and slow your recovery, so you will need to focus on controlling your diabetes during the weeks before surgery. Make sure that the doctor who takes care of your diabetes knows about your planned surgery including the date and location.  How do I manage my blood sugar before surgery? Check your blood sugar at least 4 times a day, starting 2 days before surgery, to make sure that the level is not too high or low.  Check your blood sugar the morning of your surgery when you wake up and every 2 hours until you get to the Short Stay unit.  If your blood sugar is less than 70 mg/dL, you will need to treat for low blood sugar: Do not take insulin. Treat a low blood sugar (less than 70 mg/dL) with  cup of clear juice (cranberry or apple), 4 glucose tablets, OR glucose gel. Recheck blood sugar in 15 minutes after treatment (to make sure it is greater than 70 mg/dL). If your blood sugar is not greater than 70 mg/dL on recheck, call [phone] for  further instructions. Report your blood sugar to the short stay nurse when you get to Short Stay.  If you are admitted to the hospital after surgery: Your blood sugar will be checked by the staff and you will probably be given insulin after surgery (instead of oral diabetes medicines) to make sure you have good blood sugar levels. The goal for blood sugar control after surgery is 80-180 mg/dL.   [facility] is not responsible for any belongings or valuables.    Do NOT Smoke  (Tobacco/Vaping)  24 hours prior to your procedure  If you use a CPAP at night, you may bring your mask for your overnight stay.   Contacts, glasses, hearing aids, dentures or partials may not be worn into surgery, please bring cases for these belongings   For patients admitted to the hospital, discharge time will be determined by your treatment team.   Patients discharged the day of surgery will not be allowed to drive home, and someone needs to stay with them for 24 hours.   SURGICAL WAITING ROOM VISITATION Patients having surgery or a procedure may have no more than 2 support people in the waiting area - these visitors may rotate.   Children under the age of 48 must have an adult with them who is not the patient. If the patient needs to stay at the hospital during part of their recovery, the visitor guidelines for inpatient rooms apply. Pre-op nurse will coordinate an appropriate time for 1 support person to accompany patient in pre-op.  This support person may not rotate.   Please refer to the [facility] website for the visitor guidelines for Inpatients (after your surgery is over and you are in a regular room).    Special instructions:    Oral Hygiene is also important to reduce your risk of infection.  Remember - BRUSH YOUR TEETH THE MORNING OF SURGERY WITH YOUR REGULAR TOOTHPASTE   [place]- Preparing For Surgery  Before surgery, you can play an important role. Because skin is not sterile, your skin needs to be as free of germs as possible. You can reduce the number of germs on your skin by washing with CHG (chlorahexidine gluconate) Soap before surgery.  CHG is an antiseptic cleaner which kills germs and bonds with the skin to continue killing germs even after washing.     Please do not use if you have an allergy to CHG or antibacterial soaps. If your skin becomes reddened/irritated stop using the CHG.  Do not shave (including legs and underarms) for at least 48 hours prior  to first CHG shower. It is OK to shave your face.  Please follow these instructions carefully.     Shower the NIGHT BEFORE SURGERY and the MORNING OF SURGERY with CHG Soap.   If you chose to wash your hair, wash your hair first as usual with your normal shampoo. After you shampoo, rinse your hair and body thoroughly to remove the shampoo.  Then [employer] and genitals (private parts) with your normal soap and rinse thoroughly to remove soap.  After that Use CHG Soap as you would any other liquid soap. You can apply CHG directly to the skin and wash gently with a scrungie or a clean washcloth.   Apply the CHG Soap to your body ONLY FROM THE NECK DOWN.  Do not use on open wounds or open sores. Avoid contact with your eyes, ears, mouth and genitals (private parts). Wash Face and genitals (private parts)  with  your normal soap.   Wash thoroughly, paying special attention to the area where your surgery will be performed.  Thoroughly rinse your body with warm water from the neck down.  DO NOT shower/wash with your normal soap after using and rinsing off the CHG Soap.  Pat yourself dry with a CLEAN TOWEL.  Wear CLEAN PAJAMAS to bed the night before surgery  Place CLEAN SHEETS on your bed the night before your surgery  DO NOT SLEEP WITH PETS.   Day of Surgery:  Take a shower with CHG soap. Wear Clean/Comfortable clothing the morning of surgery Do not wear jewelry or makeup. Do not wear lotions, powders, perfumes/cologne or deodorant. Do not shave 48 hours prior to surgery.  Men may shave face and neck. Do not bring valuables to the hospital. Do not wear nail polish, gel polish, artificial nails, or any other type of covering on natural nails (fingers and toes) If you have artificial nails or gel coating that need to be removed by a nail salon, please have this removed prior to surgery. Artificial nails or gel coating may interfere with anesthesia's ability to adequately monitor your vital  signs. Remember to brush your teeth WITH YOUR REGULAR TOOTHPASTE.    If you received a COVID test during your pre-op visit, it is requested that you wear a mask when out in public, stay away from anyone that may not be feeling well, and notify your surgeon if you develop symptoms. If you have been in contact with anyone that has tested positive in the last 10 days, please notify your surgeon.    Please read over the following fact sheets that you were given.

## 2021-10-12 ENCOUNTER — Encounter (HOSPITAL_COMMUNITY): Payer: Self-pay

## 2021-10-12 ENCOUNTER — Encounter (HOSPITAL_COMMUNITY)
Admission: RE | Admit: 2021-10-12 | Discharge: 2021-10-12 | Disposition: A | Discharge status: Home / Self Care | Payer: Medicare Other | Source: Ambulatory Visit | Attending: Orthopedic Surgery | Admitting: Orthopedic Surgery

## 2021-10-12 ENCOUNTER — Other Ambulatory Visit: Payer: Self-pay

## 2021-10-12 VITALS — BP 127/80 | HR 71 | Temp 98.3°F | Resp 17 | Ht 68.0 in | Wt 190.5 lb

## 2021-10-12 DIAGNOSIS — R002 Palpitations: Secondary | ICD-10-CM | POA: Insufficient documentation

## 2021-10-12 DIAGNOSIS — I10 Essential (primary) hypertension: Secondary | ICD-10-CM | POA: Diagnosis not present

## 2021-10-12 DIAGNOSIS — I2510 Atherosclerotic heart disease of native coronary artery without angina pectoris: Secondary | ICD-10-CM | POA: Insufficient documentation

## 2021-10-12 DIAGNOSIS — Z01818 Encounter for other preprocedural examination: Secondary | ICD-10-CM

## 2021-10-12 DIAGNOSIS — Z01812 Encounter for preprocedural laboratory examination: Secondary | ICD-10-CM | POA: Insufficient documentation

## 2021-10-12 HISTORY — DX: Anemia, unspecified: D64.9

## 2021-10-12 HISTORY — DX: Prediabetes: R73.03

## 2021-10-12 LAB — CBC
HCT: 30.9 % — ABNORMAL LOW (ref 39.0–52.0)
Hemoglobin: 10.5 g/dL — ABNORMAL LOW (ref 13.0–17.0)
MCH: 27.2 pg (ref 26.0–34.0)
MCHC: 34 g/dL (ref 30.0–36.0)
MCV: 80.1 fL (ref 80.0–100.0)
Platelets: 391 10*3/uL (ref 150–400)
RBC: 3.86 MIL/uL — ABNORMAL LOW (ref 4.22–5.81)
RDW: 13.1 % (ref 11.5–15.5)
WBC: 5.9 10*3/uL (ref 4.0–10.5)
nRBC: 0 % (ref 0.0–0.2)

## 2021-10-12 LAB — BASIC METABOLIC PANEL
Anion gap: 6 (ref 5–15)
BUN: 9 mg/dL (ref 6–20)
CO2: 24 mmol/L (ref 22–32)
Calcium: 9.2 mg/dL (ref 8.9–10.3)
Chloride: 114 mmol/L — ABNORMAL HIGH (ref 98–111)
Creatinine, Ser: 1.06 mg/dL (ref 0.61–1.24)
GFR, Estimated: >60 mL/min (ref >60)
Glucose, Bld: 118 mg/dL — ABNORMAL HIGH (ref 70–99)
Potassium: 3.6 mmol/L (ref 3.5–5.1)
Sodium: 144 mmol/L (ref 135–145)

## 2021-10-12 LAB — SURGICAL PCR SCREEN
MRSA, PCR: NEGATIVE
Staphylococcus aureus: NEGATIVE

## 2021-10-12 NOTE — Progress Notes (Addendum)
PCP - Eartha Inch, MD Cardiologist - Wille Glaser, MD   Neurologist- Patrcia Dolly, MD  PPM/ICD - denies Chest x-ray - N/A EKG - 10/26/20- requested EKG from 05/2021 in careeverywhere Stress Test - reports normal stress test "years go" ECHO - 12/12/18 Cardiac Cath - denies  Sleep Study - denies   Fasting Blood Sugar - Patient states he is pre-diabetic. Last Hgb A1c 5.8 on 05/02/21. Pt reports he uses a Continuous glucose monitor at home. He reports his blood sugar is normally 98-116 before he eats. Pt states he will monitor blood sugar prior to surgery and will wear his continuous monitor on the day of surgery.   Patient instructed to bring his spinal cord stimulator control on the day of surgery.   Aspirin Instructions: Pt reports he takes aspirin as needed, but was told by Dr Yevette Edwards to stop taking aspirin and NSAIDS prior to surgery.   ERAS Protcol -ERAS + G2   COVID TEST-  N/A   Anesthesia review: hx seizures (last in 2021), pt reports he might need clearance from cardiologist or PCP prior to surgery and he would contact Dr. Marshell Levan scheduler to see if this is needed. Pt denies any cardiac symptoms or cardiac issues. EKG requested from Cardiologist from 05/2021.   Patient denies shortness of breath, fever, cough and chest pain at PAT appointment   All instructions explained to the patient, with a verbal understanding of the material. Patient agrees to go over the instructions while at home for a better understanding.  The opportunity to ask questions was provided.

## 2021-10-13 NOTE — Anesthesia Preprocedure Evaluation (Signed)
Anesthesia Evaluation  Patient identified by MRN, date of birth, ID band Patient awake    Reviewed: Allergy & Precautions, NPO status , Patient's Chart, lab work & pertinent test results, reviewed documented beta blocker date and time   Airway Mallampati: III  TM Distance: >3 FB Neck ROM: Full    Dental  (+) Teeth Intact, Dental Advisory Given   Pulmonary  Snores at night, tested negative for sleep apnea    Pulmonary exam normal breath sounds clear to auscultation       Cardiovascular hypertension (135/79 in preop), Pt. on medications and Pt. on home beta blockers Normal cardiovascular exam Rhythm:Regular Rate:Normal  Follows with cardiology at [facility] for history of palpitations, mild CAD, HTN.  7-day Holter monitor March 2022 was unremarkable with no significant arrhythmias noted.  Echo December 2020 showed normal LV systolic function, normal wall motion, no significant valvular disease.  Per cardiology notes, coronary CTA 2019 done in [place] showed mild nonobstructive coronary disease.  Last seen 06/29/2020 by Dr. Houston Siren.  Per note, patient walks 1 to 2 miles 3 days a week without issue.   Neuro/Psych Seizures - (last one 2020), Well Controlled,  PSYCHIATRIC DISORDERS Anxiety Depression Took depakote this AM    GI/Hepatic negative GI ROS, Neg liver ROS,   Endo/Other  diabetes, Well Controlled, Type 2, Oral Hypoglycemic Agentsa1c 5.8  Renal/GU negative Renal ROS  negative genitourinary   Musculoskeletal LBP   Abdominal   Peds  Hematology  (+) Blood dyscrasia, anemia , Hb 10.5, plt 391   Anesthesia Other Findings   Reproductive/Obstetrics negative OB ROS                           Anesthesia Physical Anesthesia Plan  ASA: 2  Anesthesia Plan: General   Post-op Pain Management: Tylenol PO (pre-op)*   Induction: Intravenous  PONV Risk Score and Plan: 2 and Ondansetron, Dexamethasone,  Midazolam and Treatment may vary due to age or medical condition  Airway Management Planned: Oral ETT  Additional Equipment: None  Intra-op Plan:   Post-operative Plan: Extubation in OR  Informed Consent: I have reviewed the patients History and Physical, chart, labs and discussed the procedure including the risks, benefits and alternatives for the proposed anesthesia with the patient or authorized representative who has indicated his/her understanding and acceptance.     Dental advisory given  Plan Discussed with: CRNA  Anesthesia Plan Comments:       Anesthesia Quick Evaluation

## 2021-10-13 NOTE — Progress Notes (Signed)
Anesthesia Chart Review:  Follows with cardiology at [facility] for history of palpitations, mild CAD, HTN.  7-day Holter monitor March 2022 was unremarkable with no significant arrhythmias noted.  Echo December 2020 showed normal LV systolic function, normal wall motion, no significant valvular disease.  Per cardiology notes, coronary CTA 2019 done in [place] showed mild nonobstructive coronary disease.  Last seen 06/29/2020 by Dr. Houston Siren.  Per note, patient walks 1 to 2 miles 3 days a week without issue.  Patient takes metformin and uses continuous glucose monitor, however, he is not diabetic.  Last A1c 5.8 on 05/02/2021.  He reports he is prediabetic.  Labs reviewed, mild anemia with hemoglobin 10.5, otherwise unremarkable.  EKG 06/29/2020 (Care Everywhere): Sinus rhythm.  Rate 69.  Slight early repolarization changes.  Within normal limits.  Event monitor 04/13/2020 (Care Everywhere): Patient remained in sinus rhythm with minimum HR 47 at 9:33 AM on day 7, max HR 143 at 5:12 PM on day 6.  Average HR 71.   Patient remained bradycardic 15%  of total time.   Patient remained tachycardic 3.1% of total time.   Few PVCs noted representing 0.3 % of total beats.  2 couplets of PVCs noted.   Only 252 supraventricular ectopic beats noted.  4 couplets of PACs noted.   2 patient triggered episodes were recorded.  During those episodes strips revealed sinus rhythm with heart rate 82-85 with no significant arrhythmias noted.   The longest R to R interval 1.25 seconds at 9:33 AM on February 13.   No SVT, no A. fib, no VT, no long pauses 3 seconds or more noted.  TTE 12/12/2018 (Care Everywhere): Wall thickness is normal.  Systolic function is normal with an ejection fraction of 55-65%. Wall  motion is within normal limits. There is no diastolic dysfunction and  normal left atrial pressure. There is no thrombus.   Zannie Cove [facility] Short Stay Center/Anesthesiology Phone [phone] 10/13/2021 1:14 PM

## 2021-10-19 ENCOUNTER — Ambulatory Visit (HOSPITAL_COMMUNITY)
Admission: RE | Admit: 2021-10-19 | Discharge: 2021-10-19 | Disposition: A | Discharge status: Home / Self Care | Payer: Medicare Other | Attending: Orthopedic Surgery | Admitting: Orthopedic Surgery

## 2021-10-19 ENCOUNTER — Encounter (HOSPITAL_COMMUNITY)
Admission: RE | Disposition: A | Discharge status: Home / Self Care | Payer: Self-pay | Source: Home / Self Care | Attending: Orthopedic Surgery

## 2021-10-19 ENCOUNTER — Ambulatory Visit (HOSPITAL_BASED_OUTPATIENT_CLINIC_OR_DEPARTMENT_OTHER): Payer: Medicare Other | Admitting: Anesthesiology

## 2021-10-19 ENCOUNTER — Other Ambulatory Visit: Payer: Self-pay

## 2021-10-19 ENCOUNTER — Ambulatory Visit (HOSPITAL_COMMUNITY): Payer: Medicare Other | Admitting: Physician Assistant

## 2021-10-19 ENCOUNTER — Encounter (HOSPITAL_COMMUNITY): Payer: Self-pay | Admitting: Orthopedic Surgery

## 2021-10-19 ENCOUNTER — Ambulatory Visit (HOSPITAL_COMMUNITY): Payer: Medicare Other

## 2021-10-19 DIAGNOSIS — G894 Chronic pain syndrome: Secondary | ICD-10-CM | POA: Diagnosis present

## 2021-10-19 DIAGNOSIS — D649 Anemia, unspecified: Secondary | ICD-10-CM | POA: Insufficient documentation

## 2021-10-19 DIAGNOSIS — Z79899 Other long term (current) drug therapy: Secondary | ICD-10-CM | POA: Insufficient documentation

## 2021-10-19 DIAGNOSIS — M4804 Spinal stenosis, thoracic region: Secondary | ICD-10-CM | POA: Diagnosis not present

## 2021-10-19 DIAGNOSIS — I2510 Atherosclerotic heart disease of native coronary artery without angina pectoris: Secondary | ICD-10-CM | POA: Diagnosis not present

## 2021-10-19 DIAGNOSIS — M4326 Fusion of spine, lumbar region: Secondary | ICD-10-CM | POA: Diagnosis not present

## 2021-10-19 DIAGNOSIS — Z7984 Long term (current) use of oral hypoglycemic drugs: Secondary | ICD-10-CM | POA: Insufficient documentation

## 2021-10-19 DIAGNOSIS — G4730 Sleep apnea, unspecified: Secondary | ICD-10-CM | POA: Insufficient documentation

## 2021-10-19 DIAGNOSIS — E119 Type 2 diabetes mellitus without complications: Secondary | ICD-10-CM | POA: Diagnosis not present

## 2021-10-19 DIAGNOSIS — Y831 Surgical operation with implant of artificial internal device as the cause of abnormal reaction of the patient, or of later complication, without mention of misadventure at the time of the procedure: Secondary | ICD-10-CM | POA: Insufficient documentation

## 2021-10-19 DIAGNOSIS — M545 Low back pain, unspecified: Secondary | ICD-10-CM | POA: Diagnosis not present

## 2021-10-19 DIAGNOSIS — Z981 Arthrodesis status: Secondary | ICD-10-CM | POA: Diagnosis not present

## 2021-10-19 DIAGNOSIS — F32A Depression, unspecified: Secondary | ICD-10-CM | POA: Insufficient documentation

## 2021-10-19 DIAGNOSIS — T85122A Displacement of implanted electronic neurostimulator (electrode) of spinal cord, initial encounter: Secondary | ICD-10-CM | POA: Diagnosis not present

## 2021-10-19 DIAGNOSIS — I10 Essential (primary) hypertension: Secondary | ICD-10-CM | POA: Insufficient documentation

## 2021-10-19 DIAGNOSIS — R569 Unspecified convulsions: Secondary | ICD-10-CM | POA: Insufficient documentation

## 2021-10-19 DIAGNOSIS — F419 Anxiety disorder, unspecified: Secondary | ICD-10-CM | POA: Diagnosis not present

## 2021-10-19 HISTORY — PX: SPINAL CORD STIMULATOR INSERTION: SHX5378

## 2021-10-19 LAB — GLUCOSE, CAPILLARY: Glucose-Capillary: 110 mg/dL — ABNORMAL HIGH (ref 70–99)

## 2021-10-19 SURGERY — INSERTION, SPINAL CORD STIMULATOR, LUMBAR
Anesthesia: General | Site: Spine Lumbar

## 2021-10-19 MED ORDER — ORAL CARE MOUTH RINSE: 15.0000 mL | Freq: Once | OROMUCOSAL | Status: AC

## 2021-10-19 MED ORDER — BUPIVACAINE-EPINEPHRINE 0.25% -1:200000 IJ SOLN
INTRAMUSCULAR | Status: DC | PRN
  Administered 2021-10-19: 20 mL

## 2021-10-19 MED ORDER — THROMBIN 20000 UNITS EX SOLR
CUTANEOUS | Status: DC | PRN
  Administered 2021-10-19: 20000 [IU] via TOPICAL

## 2021-10-19 MED ORDER — PHENYLEPHRINE HCL-NACL 20-0.9 MG/250ML-% IV SOLN
INTRAVENOUS | Status: DC | PRN
  Administered 2021-10-19: 25 ug/min via INTRAVENOUS

## 2021-10-19 MED ORDER — FENTANYL CITRATE (PF) 250 MCG/5ML IJ SOLN
INTRAMUSCULAR | Status: AC
  Filled 2021-10-19: qty 5

## 2021-10-19 MED ORDER — ACETAMINOPHEN 500 MG PO TABS
ORAL_TABLET | ORAL | Status: AC
  Administered 2021-10-19: 1000 mg via ORAL
  Filled 2021-10-19: qty 2

## 2021-10-19 MED ORDER — ROCURONIUM BROMIDE 10 MG/ML (PF) SYRINGE
PREFILLED_SYRINGE | INTRAVENOUS | Status: AC
  Filled 2021-10-19: qty 10

## 2021-10-19 MED ORDER — ONDANSETRON HCL 4 MG/2ML IJ SOLN: 4.0000 mg | Freq: Once | INTRAMUSCULAR | Status: DC | PRN

## 2021-10-19 MED ORDER — HYDROMORPHONE HCL 1 MG/ML IJ SOLN: 0.2500 mg | INTRAMUSCULAR | Status: DC | PRN

## 2021-10-19 MED ORDER — HYDROMORPHONE HCL 1 MG/ML IJ SOLN
INTRAMUSCULAR | Status: DC | PRN
  Administered 2021-10-19: .5 mg via INTRAVENOUS

## 2021-10-19 MED ORDER — METHOCARBAMOL 750 MG PO TABS: 750.0000 mg | ORAL_TABLET | Freq: Four times a day (QID) | ORAL | 0 refills | Status: DC | PRN

## 2021-10-19 MED ORDER — BUPIVACAINE LIPOSOME 1.3 % IJ SUSP
INTRAMUSCULAR | Status: AC
  Filled 2021-10-19: qty 20

## 2021-10-19 MED ORDER — EPHEDRINE 5 MG/ML INJ
INTRAVENOUS | Status: AC
  Filled 2021-10-19: qty 5

## 2021-10-19 MED ORDER — DEXMEDETOMIDINE HCL IN NACL 80 MCG/20ML IV SOLN
INTRAVENOUS | Status: DC | PRN
  Administered 2021-10-19 (×5): 8 ug via BUCCAL

## 2021-10-19 MED ORDER — GELATIN ABSORBABLE MT POWD
OROMUCOSAL | Status: DC | PRN
  Administered 2021-10-19: 1 via TOPICAL

## 2021-10-19 MED ORDER — LIDOCAINE 2% (20 MG/ML) 5 ML SYRINGE
INTRAMUSCULAR | Status: DC | PRN
  Administered 2021-10-19: 60 mg via INTRAVENOUS

## 2021-10-19 MED ORDER — POVIDONE-IODINE 7.5 % EX SOLN
Freq: Once | CUTANEOUS | Status: AC
  Filled 2021-10-19: qty 118

## 2021-10-19 MED ORDER — 0.9 % SODIUM CHLORIDE (POUR BTL) OPTIME
TOPICAL | Status: DC | PRN
  Administered 2021-10-19 (×2): 1000 mL

## 2021-10-19 MED ORDER — DEXAMETHASONE SODIUM PHOSPHATE 10 MG/ML IJ SOLN
INTRAMUSCULAR | Status: AC
  Filled 2021-10-19: qty 1

## 2021-10-19 MED ORDER — BUPIVACAINE-EPINEPHRINE (PF) 0.25% -1:200000 IJ SOLN
INTRAMUSCULAR | Status: AC
  Filled 2021-10-19: qty 30

## 2021-10-19 MED ORDER — SUCCINYLCHOLINE CHLORIDE 200 MG/10ML IV SOSY
PREFILLED_SYRINGE | INTRAVENOUS | Status: AC
  Filled 2021-10-19: qty 10

## 2021-10-19 MED ORDER — ROCURONIUM BROMIDE 10 MG/ML (PF) SYRINGE
PREFILLED_SYRINGE | INTRAVENOUS | Status: DC | PRN
  Administered 2021-10-19: 10 mg via INTRAVENOUS
  Administered 2021-10-19: 100 mg via INTRAVENOUS

## 2021-10-19 MED ORDER — HYDROCODONE-ACETAMINOPHEN 5-325 MG PO TABS: 1.0000 | ORAL_TABLET | Freq: Four times a day (QID) | ORAL | 0 refills | Status: DC | PRN

## 2021-10-19 MED ORDER — PROPOFOL 10 MG/ML IV BOLUS
INTRAVENOUS | Status: AC
  Filled 2021-10-19: qty 20

## 2021-10-19 MED ORDER — SUGAMMADEX SODIUM 200 MG/2ML IV SOLN
INTRAVENOUS | Status: DC | PRN
  Administered 2021-10-19: 200 mg via INTRAVENOUS

## 2021-10-19 MED ORDER — HYDROMORPHONE HCL 1 MG/ML IJ SOLN
INTRAMUSCULAR | Status: AC
  Filled 2021-10-19: qty 0.5

## 2021-10-19 MED ORDER — PHENYLEPHRINE 80 MCG/ML (10ML) SYRINGE FOR IV PUSH (FOR BLOOD PRESSURE SUPPORT)
PREFILLED_SYRINGE | INTRAVENOUS | Status: AC
  Filled 2021-10-19: qty 10

## 2021-10-19 MED ORDER — PROPOFOL 10 MG/ML IV BOLUS
INTRAVENOUS | Status: DC | PRN
  Administered 2021-10-19: 200 mg via INTRAVENOUS

## 2021-10-19 MED ORDER — CEFAZOLIN SODIUM-DEXTROSE 2-4 GM/100ML-% IV SOLN
INTRAVENOUS | Status: AC
  Filled 2021-10-19: qty 100

## 2021-10-19 MED ORDER — LACTATED RINGERS IV SOLN: INTRAVENOUS | Status: DC

## 2021-10-19 MED ORDER — CEFAZOLIN SODIUM-DEXTROSE 2-4 GM/100ML-% IV SOLN
2.0000 g | INTRAVENOUS | Status: AC
  Administered 2021-10-19: 2 g via INTRAVENOUS

## 2021-10-19 MED ORDER — ONDANSETRON HCL 4 MG/2ML IJ SOLN
INTRAMUSCULAR | Status: DC | PRN
  Administered 2021-10-19: 4 mg via INTRAVENOUS

## 2021-10-19 MED ORDER — OXYCODONE HCL 5 MG PO TABS: 5.0000 mg | ORAL_TABLET | Freq: Once | ORAL | Status: DC | PRN

## 2021-10-19 MED ORDER — ONDANSETRON HCL 4 MG/2ML IJ SOLN
INTRAMUSCULAR | Status: AC
  Filled 2021-10-19: qty 2

## 2021-10-19 MED ORDER — FENTANYL CITRATE (PF) 250 MCG/5ML IJ SOLN
INTRAMUSCULAR | Status: DC | PRN
  Administered 2021-10-19 (×2): 50 ug via INTRAVENOUS
  Administered 2021-10-19: 25 ug via INTRAVENOUS
  Administered 2021-10-19: 50 ug via INTRAVENOUS
  Administered 2021-10-19 (×3): 25 ug via INTRAVENOUS

## 2021-10-19 MED ORDER — PHENYLEPHRINE 80 MCG/ML (10ML) SYRINGE FOR IV PUSH (FOR BLOOD PRESSURE SUPPORT)
PREFILLED_SYRINGE | INTRAVENOUS | Status: DC | PRN
  Administered 2021-10-19 (×5): 80 ug via INTRAVENOUS

## 2021-10-19 MED ORDER — CHLORHEXIDINE GLUCONATE 0.12 % MT SOLN
OROMUCOSAL | Status: AC
  Administered 2021-10-19: 15 mL via OROMUCOSAL
  Filled 2021-10-19: qty 15

## 2021-10-19 MED ORDER — MIDAZOLAM HCL 2 MG/2ML IJ SOLN
INTRAMUSCULAR | Status: DC | PRN
  Administered 2021-10-19: 2 mg via INTRAVENOUS

## 2021-10-19 MED ORDER — MIDAZOLAM HCL 2 MG/2ML IJ SOLN
INTRAMUSCULAR | Status: AC
  Filled 2021-10-19: qty 2

## 2021-10-19 MED ORDER — OXYCODONE HCL 5 MG/5ML PO SOLN: 5.0000 mg | Freq: Once | ORAL | Status: DC | PRN

## 2021-10-19 MED ORDER — THROMBIN 20000 UNITS EX KIT
PACK | CUTANEOUS | Status: AC
  Filled 2021-10-19: qty 1

## 2021-10-19 MED ORDER — EPHEDRINE SULFATE-NACL 50-0.9 MG/10ML-% IV SOSY
PREFILLED_SYRINGE | INTRAVENOUS | Status: DC | PRN
  Administered 2021-10-19 (×2): 5 mg via INTRAVENOUS

## 2021-10-19 MED ORDER — BUPIVACAINE LIPOSOME 1.3 % IJ SUSP
INTRAMUSCULAR | Status: DC | PRN
  Administered 2021-10-19: 10 mL
  Administered 2021-10-19: 20 mL

## 2021-10-19 MED ORDER — LIDOCAINE 2% (20 MG/ML) 5 ML SYRINGE
INTRAMUSCULAR | Status: AC
  Filled 2021-10-19: qty 5

## 2021-10-19 MED ORDER — CHLORHEXIDINE GLUCONATE 0.12 % MT SOLN: 15.0000 mL | Freq: Once | OROMUCOSAL | Status: AC

## 2021-10-19 MED ORDER — AMISULPRIDE (ANTIEMETIC) 5 MG/2ML IV SOLN: 10.0000 mg | Freq: Once | INTRAVENOUS | Status: DC | PRN

## 2021-10-19 MED ORDER — ACETAMINOPHEN 500 MG PO TABS: 1000.0000 mg | ORAL_TABLET | Freq: Once | ORAL | Status: AC

## 2021-10-19 MED ORDER — DEXAMETHASONE SODIUM PHOSPHATE 10 MG/ML IJ SOLN
INTRAMUSCULAR | Status: DC | PRN
  Administered 2021-10-19: 10 mg via INTRAVENOUS

## 2021-10-19 SURGICAL SUPPLY — 65 items
ACC NRSTM 4 TRQ WRNCH STRL (MISCELLANEOUS) ×1
APL SKNCLS STERI-STRIP NONHPOA (GAUZE/BANDAGES/DRESSINGS) ×3
BAG COUNTER SPONGE SURGICOUNT (BAG) ×1 IMPLANT
BAG ISL DRAPE 18X18 STRL (DRAPES)
BAG ISOLATION DRAPE 18X18 (DRAPES) ×1 IMPLANT
BAG SPNG CNTER NS LX DISP (BAG) ×1
BENZOIN TINCTURE PRP APPL 2/3 (GAUZE/BANDAGES/DRESSINGS) ×1 IMPLANT
BUR MATCHSTICK NEURO 3.0 LAGG (BURR) IMPLANT
CANISTER SUCT 3000ML PPV (MISCELLANEOUS) ×1 IMPLANT
CORD BIPOLAR FORCEPS 12FT (ELECTRODE) ×1 IMPLANT
COVER PROBE W GEL 5X96 (DRAPES) IMPLANT
DRAPE C-ARM 42X72 X-RAY (DRAPES) ×1 IMPLANT
DRAPE ISOLATION BAG 18X18 (DRAPES)
DRAPE POUCH INSTRU U-SHP 10X18 (DRAPES) ×1 IMPLANT
DRAPE SURG 17X23 STRL (DRAPES) ×3 IMPLANT
DURAPREP 26ML APPLICATOR (WOUND CARE) ×1 IMPLANT
ELECT CAUTERY BLADE 6.4 (BLADE) ×1 IMPLANT
ELECT REM PT RETURN 9FT ADLT (ELECTROSURGICAL) ×1
ELECTRODE REM PT RTRN 9FT ADLT (ELECTROSURGICAL) ×1 IMPLANT
GAUZE 4X4 16PLY ~~LOC~~+RFID DBL (SPONGE) ×1 IMPLANT
GAUZE SPONGE 4X4 12PLY STRL (GAUZE/BANDAGES/DRESSINGS) ×1 IMPLANT
GAUZE SPONGE 4X4 12PLY STRL LF (GAUZE/BANDAGES/DRESSINGS) IMPLANT
GLOVE BIO SURGEON STRL SZ7 (GLOVE) ×1 IMPLANT
GLOVE BIO SURGEON STRL SZ8 (GLOVE) ×1 IMPLANT
GLOVE BIOGEL PI IND STRL 8 (GLOVE) ×1 IMPLANT
GLOVE SURG ENC MOIS LTX SZ6.5 (GLOVE) ×1 IMPLANT
GOWN STRL REUS W/ TWL LRG LVL3 (GOWN DISPOSABLE) ×2 IMPLANT
GOWN STRL REUS W/ TWL XL LVL3 (GOWN DISPOSABLE) ×1 IMPLANT
GOWN STRL REUS W/TWL LRG LVL3 (GOWN DISPOSABLE) ×2
GOWN STRL REUS W/TWL XL LVL3 (GOWN DISPOSABLE) ×1
KIT BASIN OR (CUSTOM PROCEDURE TRAY) ×1 IMPLANT
KIT NEURO ACCESSORY W/WRENCH (MISCELLANEOUS) IMPLANT
KIT TURNOVER KIT B (KITS) ×1 IMPLANT
NDL HYPO 25GX1X1/2 BEV (NEEDLE) ×1 IMPLANT
NDL SUT 6 .5 CRC .975X.05 MAYO (NEEDLE) ×1 IMPLANT
NEEDLE HYPO 25GX1X1/2 BEV (NEEDLE) ×2 IMPLANT
NEEDLE MAYO TAPER (NEEDLE)
NS IRRIG 1000ML POUR BTL (IV SOLUTION) ×1 IMPLANT
PACK LAMINECTOMY ORTHO (CUSTOM PROCEDURE TRAY) ×1 IMPLANT
PACK UNIVERSAL I (CUSTOM PROCEDURE TRAY) ×1 IMPLANT
PAD ARMBOARD 7.5X6 YLW CONV (MISCELLANEOUS) ×2 IMPLANT
SPONGE INTESTINAL PEANUT (DISPOSABLE) IMPLANT
SPONGE SURGIFOAM ABS GEL SZ50 (HEMOSTASIS) IMPLANT
SPONGE T-LAP 4X18 ~~LOC~~+RFID (SPONGE) ×1 IMPLANT
STIMULATOR CORD SURESCAN MRI (Stimulator) IMPLANT
STRIP CLOSURE SKIN 1/2X4 (GAUZE/BANDAGES/DRESSINGS) ×1 IMPLANT
STRIP CLOSURE SKIN 1/4X4 (GAUZE/BANDAGES/DRESSINGS) IMPLANT
SUT FIBERWIRE 2-0 18 17.9 3/8 (SUTURE) ×1
SUT MNCRL AB 3-0 PS2 18 (SUTURE) ×2 IMPLANT
SUT MNCRL+ AB 3-0 CT1 36 (SUTURE) IMPLANT
SUT MONOCRYL AB 3-0 CT1 36IN (SUTURE) ×1
SUT SILK 3 0 SH CR/8 (SUTURE) IMPLANT
SUT VIC AB 0 CT1 18XCR BRD 8 (SUTURE) ×1 IMPLANT
SUT VIC AB 0 CT1 8-18 (SUTURE) ×1
SUT VIC AB 1 CT1 18XCR BRD 8 (SUTURE) ×1 IMPLANT
SUT VIC AB 1 CT1 8-18 (SUTURE) ×1
SUT VIC AB 2-0 CT2 18 VCP726D (SUTURE) ×1 IMPLANT
SUTURE FIBERWR 2-0 18 17.9 3/8 (SUTURE) ×1 IMPLANT
SYR BULB IRRIG 60ML STRL (SYRINGE) ×1 IMPLANT
SYR CONTROL 10ML LL (SYRINGE) ×1 IMPLANT
TAPE CLOTH SURG 4X10 WHT LF (GAUZE/BANDAGES/DRESSINGS) IMPLANT
TOWEL GREEN STERILE (TOWEL DISPOSABLE) ×1 IMPLANT
TOWEL GREEN STERILE FF (TOWEL DISPOSABLE) ×1 IMPLANT
TRAY FOLEY MTR SLVR 16FR STAT (SET/KITS/TRAYS/PACK) IMPLANT
WATER STERILE IRR 1000ML POUR (IV SOLUTION) ×1 IMPLANT

## 2021-10-19 NOTE — Transfer of Care (Signed)
Immediate Anesthesia Transfer of Care Note  Patient: Manuel Tucker  Procedure(s) Performed: PLACEMENT OF REVISION SPINAL CORD STIMULATOR PADDLE AND REMOVAL OF PERCUTANEOUS LEADS (Spine Lumbar)  Patient Location: PACU  Anesthesia Type:General  Level of Consciousness: drowsy and patient cooperative  Airway & Oxygen Therapy: Patient Spontanous Breathing and Patient connected to face mask oxygen  Post-op Assessment: Report given to RN, Post -op Vital signs reviewed and stable and Patient moving all extremities X 4  Post vital signs: Reviewed and stable  Last Vitals:  Vitals Value Taken Time  BP 134/72 10/19/21 1821  Temp    Pulse 98 10/19/21 1822  Resp 24 10/19/21 1822  SpO2 97 % 10/19/21 1822  Vitals shown include unvalidated device data.  Last Pain:  Vitals:   10/19/21 1125  TempSrc:   PainSc: 6          Complications: No notable events documented.

## 2021-10-19 NOTE — H&P (Signed)
PREOPERATIVE H&P  Chief Complaint: Low back pain  HPI: Manuel Tucker is a 51 y.o. male who presents with ongoing pain in the low back.  The patient previously had percutaneous leads placed, which resulted in good management of his pain, however, these leads migrated.  He presented to me for a revision procedure.  Specifically, removal of his percutaneous leads, and placement of a permanent paddle stimulator.   (see office notes for additional details regarding the patient's full course of treatment)  Past Medical History:  Diagnosis Date   Anemia    history of low iron- takes B12 and Iron   Anxiety    Depression    H/O idiopathic seizure    History of ankle surgery    History of back surgery    Hypertension    Pre-diabetes    S/P insertion of spinal cord stimulator    Seizures (HCC)    last seizure 2020 per patient   Past Surgical History:  Procedure Laterality Date   BACK SURGERY     lumbar fusion per patient   BICEPS TENDON REPAIR Right 2021   FOOT SURGERY Right 2019   9 different surgeries on ankle per patient   SHOULDER SURGERY Right 2020   SPINAL CORD STIMULATOR INSERTION     spinal cord stimulator lead replacement     2021   Social History   Socioeconomic History   Marital status: Married    Spouse name: Not on file   Number of children: Not on file   Years of education: Not on file   Highest education level: Not on file  Occupational History   Not on file  Tobacco Use   Smoking status: Never   Smokeless tobacco: Never  Vaping Use   Vaping Use: Some days   Substances: Flavoring  Substance and Sexual Activity   Alcohol use: Never   Drug use: Never   Sexual activity: Yes  Other Topics Concern   Not on file  Social History Narrative   Right handed      Some college      Steps inside the home   Social Determinants of Health   Financial Resource Strain: Not on file  Food Insecurity: Not on file  Transportation Needs: Not on file   Physical Activity: Not on file  Stress: Not on file  Social Connections: Not on file   History reviewed. No pertinent family history. Allergies  Allergen Reactions   Banana Itching   Celebrex [Celecoxib] Itching, Rash and Other (See Comments)   Prior to Admission medications   Medication Sig Start Date End Date Taking? Authorizing Provider  acetaminophen (TYLENOL) 500 MG tablet Take 1 tablet (500 mg total) by mouth every 6 (six) hours as needed. 04/25/19  Yes Fawze, Mina A, PA-C  amLODipine (NORVASC) 10 MG tablet Take 10 mg by mouth daily.  10/02/18  Yes [provider]  aspirin 81 MG EC tablet Take 81 mg by mouth daily. 01/29/20  Yes [provider]  atorvastatin (LIPITOR) 20 MG tablet Take 20 mg by mouth daily. 01/10/21  Yes [provider]  carvedilol (COREG) 3.125 MG tablet Take 3.125 mg by mouth 2 (two) times daily with a meal. 01/29/20  Yes [provider]  D3-50 1.25 MG (50000 UT) capsule Take 50,000 Units by mouth every Monday. 09/13/21  Yes [provider]  divalproex (DEPAKOTE) 500 MG DR tablet Take 2 tablets twice a day 01/25/21  Yes Van Clines, MD  escitalopram (LEXAPRO) 20 MG tablet Take 1 tablet (20 mg total) by mouth daily. 08/17/21 08/17/22 Yes Arfeen, Arlyce Harman, MD  ferrous sulfate 324 MG TBEC Take 324 mg by mouth 3 (three) times a week.   Yes [provider]  hydrOXYzine (VISTARIL) 50 MG capsule Take 1 capsule (50 mg total) by mouth 2 (two) times daily as needed for anxiety. 08/17/21  Yes Arfeen, Arlyce Harman, MD  lisinopril (ZESTRIL) 20 MG tablet Take 20 mg by mouth daily. 05/07/19  Yes Chesley Noon, MD  metFORMIN (GLUCOPHAGE-XR) 500 MG 24 hr tablet Take 500 mg by mouth every evening. 08/25/21  Yes [provider]  oxyCODONE (OXY IR/ROXICODONE) 5 MG immediate release tablet Take 5 mg by mouth 3 (three) times daily as needed for pain. 09/24/21  Yes [provider]  QUEtiapine (SEROQUEL) 300 MG tablet Take 1  tablet (300 mg total) by mouth at bedtime. 08/17/21 08/17/22 Yes Arfeen, Arlyce Harman, MD  venlafaxine XR (EFFEXOR-XR) 75 MG 24 hr capsule Take 1 capsule (75 mg total) by mouth every morning. 08/17/21 08/17/22 Yes Arfeen, Arlyce Harman, MD  vitamin B-12 (CYANOCOBALAMIN) 500 MCG tablet Take 500 mcg by mouth daily.   Yes [provider]  methocarbamol (ROBAXIN) 500 MG tablet Take 1 tablet (500 mg total) by mouth every 6 (six) hours as needed for muscle spasms. Patient not taking: Reported on 10/06/2021 09/06/21   Carlisle Cater, PA-C  QUEtiapine (SEROQUEL) 25 MG tablet Take 1 tablet (25 mg total) by mouth daily. 05/17/21 05/17/22  Arfeen, Arlyce Harman, MD     All other systems have been reviewed and were otherwise negative with the exception of those mentioned in the HPI and as above.  Physical Exam: Vitals:   10/19/21 1105  BP: 135/79  Pulse: 68  Resp: 18  Temp: 97.7 F (36.5 C)  SpO2: 100%    Body mass index is 28.89 kg/m.  General: Alert, no acute distress Cardiovascular: No pedal edema Respiratory: No cyanosis, no use of accessory musculature Skin: No lesions in the area of chief complaint Neurologic: Sensation intact distally Psychiatric: Patient is competent for consent with normal mood and affect Lymphatic: No axillary or cervical lymphadenopathy   Assessment/Plan: Axial low back pain, previously substantially improved with a spinal cord stimulator Plan for Procedure(s): PLACEMENT OF REVISION SPINAL CORD STIMULATOR PADDLE AND REMOVAL OF PERCUTANEOUS LEADS   Manuel Karvonen, MD 10/19/2021 1:19 PM

## 2021-10-19 NOTE — OR Nursing (Signed)
Dr. Lynann Bologna explanted LEAD [phone] [employer], Manufacturer is Advice worker. X2.

## 2021-10-19 NOTE — Op Note (Signed)
PATIENT NAME: Manuel Tucker    MEDICAL RECORD NO.:  [id number]    DATE OF BIRTH: December 30, 1970    DATE OF PROCEDURE: 10/19/2021                                  OPERATIVE REPORT     PREOPERATIVE DIAGNOSES: Chronic pain syndrome, including chronic low back pain 2.   Status post previous percutaneous spinal cord stimulator lead placement, which eventually migrated resulting in loss of pain relief   POSTOPERATIVE DIAGNOSES: Chronic pain syndrome, including chronic low back pain 2.   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   ASSISTANTPricilla Holm, PA-C.   ANESTHESIA:  General endotracheal anesthesia.   COMPLICATIONS:  None.   DISPOSITION:  Stable.   ESTIMATED BLOOD LOSS:  Minimal.   INDICATIONS FOR SURGERY:  Briefly, Manuel Tucker is a very pleasant 51 year old male, who did present to me with ongoing chronic back pain.  As previously documented, he has had a lumbar fusion by another provider.  Also, he did have percutaneous spinal cord stimulator leads placed by another provider.  Unfortunately, these leads migrated.  He did present to me for removal of his percutaneous leads, and placement of a permanent spinal cord stimulator paddle. After a full discussion regarding the risks and benefits associated with surgery, he did elect to proceed.     OPERATIVE DETAILS:  On 10/19/2021 , the patient was brought to surgery and general endotracheal anesthesia was administered.  The patient was placed prone on a well-padded flat Jackson bed with a spinal  frame. Antibiotics were given and the back was prepped and draped in the usual sterile fashion.  A time-out procedure was performed.  At this point, the patient's previous incisions overlying placement of the percutaneous leads, and battery in the region of the right buttock were identified, and an incision was made over the previous incisions.  Starting at the midline, the anchors were noted, and the ties associated with the anchors were removed.  The percutaneous leads and anchors were then uneventfully removed through the incision.  I then turned my attention to the battery.  The leads were disconnected from the battery.  The battery was then removed from the region of the right buttock incision, and the percutaneous leads were removed from the midline incision.  At this point, the wounds were copiously irrigated, and closed using 2-0 Vicryl followed by 3-0 Monocryl.  At this point, I localized the T10/T11 intervertebral disc space.  An incision was made over the lower thoracic region, centered over the T10/T11 intervertebral space.  A self-retaining retractor was placed.  I then used a high-speed bur in addition to a series of Kerrison punches, in order to perform a laminectomy at T10/T11.  The laminectomy was extended as laterally as needed in order to accommodate the width of the paddle leads.  Once the ligamentum flavum was appropriately resected, and the dura was noted, I did develop a plane between the dorsal aspect of the dura and the lamina using a Woodson followed by  a brain ribbon.  I then attempted to pass a trial, in anticipation for the spinal cord stimulator paddle.  However, as I attempted to pass this superiorly, across the T9/10 intervertebral space, there was firm resistance encountered.  I did feel that additional attempts at passing the trial would be associated with an unacceptable risk of durotomy or neurologic injury.  I therefore turned my attention to the T9/10 intervertebral space.   I then performed a bilateral partial facetectomy at T9/10, at which point facet hypertrophy and ligamentum flavum hypertrophy was removed.  This did also allow me to pass a brain ribbon superiorly.  This did release adhesions that were previously identified, and did entirely decompress the T9/10 intervertebral space.  At this point, I proceeded with passing the paddle lead through the T10/11 laminectomy defect, and superiorly.  This was easily done without resistance.  This was passed up to the top of the T8 vertebral body.  This initially was to the right of the midline.  I therefore removed the paddle and again past the paddle from inferior to superior.  This is more central, and slightly to the left.  The role of leads on the right was over the midline.  Per the [employer] representative, he did feel that this placement was adequate to result in full pain relief for the patient.  I therefore did not make any additional attempts at passing the paddle.  The leads were then secured into place using 3-0 silk, 2 placed at each lead.  The wound was then copiously irrigated and the fascia was closed using #1 Vicryl.  The skin was then closed using 2-0 Vicryl followed by 4-0 Monocryl.  Of note, there was no bleeding noted in the epidural space upon closure.  I then made an incision over the patient's left flank.  A subcutaneous pocket was developed, and the leads were tunneled across the subcutaneous space and delivered to the wound at the left flank.  The leads were then secured into the battery that was previously removed.  The leads and the battery were placed into the pocket, and the left flank wound was then closed using 2-0 Vicryl followed by 4-0 Monocryl.  Benzoin and Steri-Strips were applied over all 4 wounds, followed by a sterile dressing.  All instrument counts were correct at the termination of the procedure.   Of note, Pricilla Holm, PA-C, was my assistant throughout surgery, and did aid in  retraction, suctioning, the decompression, placement of the battery and paddle, and closure from start to finish.     Phylliss Bob, MD

## 2021-10-19 NOTE — Anesthesia Procedure Notes (Signed)
Procedure Name: Intubation Date/Time: 10/19/2021 2:20 PM  Performed by: Reeves Dam, CRNAPre-anesthesia Checklist: Patient identified, Emergency Drugs available, Suction available and Patient being monitored Patient Re-evaluated:Patient Re-evaluated prior to induction Oxygen Delivery Method: Circle system utilized Preoxygenation: Pre-oxygenation with 100% oxygen Induction Type: IV induction Ventilation: Oral airway inserted - appropriate to patient size and Mask ventilation without difficulty Laryngoscope Size: Glidescope and 4 Grade View: Grade I Tube type: Oral Tube size: 7.5 mm Number of attempts: 1 Airway Equipment and Method: Video-laryngoscopy and Oral airway Placement Confirmation: ETT inserted through vocal cords under direct vision, positive ETCO2 and breath sounds checked- equal and bilateral Secured at: 23 cm Tube secured with: Tape Dental Injury: Teeth and Oropharynx as per pre-operative assessment

## 2021-10-20 ENCOUNTER — Other Ambulatory Visit: Payer: Self-pay

## 2021-10-20 ENCOUNTER — Encounter (HOSPITAL_COMMUNITY): Payer: Self-pay | Admitting: Orthopedic Surgery

## 2021-10-20 ENCOUNTER — Emergency Department (HOSPITAL_COMMUNITY)
Admission: EM | Admit: 2021-10-20 | Discharge: 2021-10-20 | Disposition: A | Discharge status: Home / Self Care | Payer: Medicare Other | Attending: Emergency Medicine | Admitting: Emergency Medicine

## 2021-10-20 ENCOUNTER — Emergency Department (HOSPITAL_COMMUNITY): Payer: Medicare Other

## 2021-10-20 DIAGNOSIS — I10 Essential (primary) hypertension: Secondary | ICD-10-CM | POA: Insufficient documentation

## 2021-10-20 DIAGNOSIS — Z79899 Other long term (current) drug therapy: Secondary | ICD-10-CM | POA: Insufficient documentation

## 2021-10-20 DIAGNOSIS — R1030 Lower abdominal pain, unspecified: Secondary | ICD-10-CM | POA: Diagnosis present

## 2021-10-20 LAB — URINALYSIS, ROUTINE W REFLEX MICROSCOPIC
Bacteria, UA: NONE SEEN
Bilirubin Urine: NEGATIVE
Glucose, UA: NEGATIVE mg/dL
Ketones, ur: NEGATIVE mg/dL
Leukocytes,Ua: NEGATIVE
Nitrite: NEGATIVE
Protein, ur: NEGATIVE mg/dL
Specific Gravity, Urine: 1.005 (ref 1.005–1.030)
pH: 6 (ref 5.0–8.0)

## 2021-10-20 LAB — CBC WITH DIFFERENTIAL/PLATELET
Abs Immature Granulocytes: 0.05 10*3/uL (ref 0.00–0.07)
Basophils Absolute: 0 10*3/uL (ref 0.0–0.1)
Basophils Relative: 0 %
Eosinophils Absolute: 0 10*3/uL (ref 0.0–0.5)
Eosinophils Relative: 0 %
HCT: 29.9 % — ABNORMAL LOW (ref 39.0–52.0)
Hemoglobin: 10.3 g/dL — ABNORMAL LOW (ref 13.0–17.0)
Immature Granulocytes: 0 %
Lymphocytes Relative: 9 %
Lymphs Abs: 1 10*3/uL (ref 0.7–4.0)
MCH: 27 pg (ref 26.0–34.0)
MCHC: 34.4 g/dL (ref 30.0–36.0)
MCV: 78.5 fL — ABNORMAL LOW (ref 80.0–100.0)
Monocytes Absolute: 0.4 10*3/uL (ref 0.1–1.0)
Monocytes Relative: 3 %
Neutro Abs: 9.9 10*3/uL — ABNORMAL HIGH (ref 1.7–7.7)
Neutrophils Relative %: 88 %
Platelets: 342 10*3/uL (ref 150–400)
RBC: 3.81 MIL/uL — ABNORMAL LOW (ref 4.22–5.81)
RDW: 13.2 % (ref 11.5–15.5)
WBC: 11.2 10*3/uL — ABNORMAL HIGH (ref 4.0–10.5)
nRBC: 0 % (ref 0.0–0.2)

## 2021-10-20 LAB — COMPREHENSIVE METABOLIC PANEL
ALT: 15 U/L (ref 0–44)
AST: 34 U/L (ref 15–41)
Albumin: 3.8 g/dL (ref 3.5–5.0)
Alkaline Phosphatase: 77 U/L (ref 38–126)
Anion gap: 11 (ref 5–15)
BUN: 10 mg/dL (ref 6–20)
CO2: 24 mmol/L (ref 22–32)
Calcium: 9.4 mg/dL (ref 8.9–10.3)
Chloride: 105 mmol/L (ref 98–111)
Creatinine, Ser: 1.32 mg/dL — ABNORMAL HIGH (ref 0.61–1.24)
GFR, Estimated: >60 mL/min (ref >60)
Glucose, Bld: 123 mg/dL — ABNORMAL HIGH (ref 70–99)
Potassium: 3.8 mmol/L (ref 3.5–5.1)
Sodium: 140 mmol/L (ref 135–145)
Total Bilirubin: 0.8 mg/dL (ref 0.3–1.2)
Total Protein: 7 g/dL (ref 6.5–8.1)

## 2021-10-20 LAB — LIPASE, BLOOD: Lipase: 21 U/L (ref 11–51)

## 2021-10-20 MED ORDER — HYDROMORPHONE HCL 2 MG PO TABS: 2.0000 mg | ORAL_TABLET | ORAL | 0 refills | Status: DC | PRN

## 2021-10-20 MED ORDER — HYDROMORPHONE HCL 1 MG/ML IJ SOLN
1.0000 mg | Freq: Once | INTRAMUSCULAR | Status: AC
  Administered 2021-10-20: 1 mg via INTRAVENOUS
  Filled 2021-10-20: qty 1

## 2021-10-20 MED ORDER — LIDOCAINE 5 % EX PTCH
1.0000 | MEDICATED_PATCH | CUTANEOUS | Status: DC
  Administered 2021-10-20: 1 via TRANSDERMAL
  Filled 2021-10-20: qty 1

## 2021-10-20 MED ORDER — KETOROLAC TROMETHAMINE 15 MG/ML IJ SOLN
15.0000 mg | Freq: Once | INTRAMUSCULAR | Status: AC
  Administered 2021-10-20: 15 mg via INTRAVENOUS
  Filled 2021-10-20: qty 1

## 2021-10-20 MED ORDER — GABAPENTIN 300 MG PO CAPS
300.0000 mg | ORAL_CAPSULE | Freq: Once | ORAL | Status: AC
  Administered 2021-10-20: 300 mg via ORAL
  Filled 2021-10-20: qty 1

## 2021-10-20 MED ORDER — IOHEXOL 350 MG/ML SOLN
75.0000 mL | Freq: Once | INTRAVENOUS | Status: AC | PRN
  Administered 2021-10-20: 75 mL via INTRAVENOUS

## 2021-10-20 MED FILL — Thrombin For Soln Kit 20000 Unit: CUTANEOUS | Qty: 1 | Status: AC

## 2021-10-20 NOTE — ED Triage Notes (Addendum)
Pt arrived via [facility] from home. Per EMS pt had leads replaced on neuro stimulator  surgery yesterday 9/20 and now is presenting with lower bilateral abdominal pain. Started before meds. Denies N/V.  Marland Kitchen  152/82, 100HR, 99%

## 2021-10-20 NOTE — Progress Notes (Signed)
Patient presented to the ER this morning with abdominal pain.  I did just finished evaluating him at the bedside.  He does have pain in the anterior abdominal region, on the right and left.  The pain is symmetric.  He states that the pain comes and goes, and he describes the pain as a sharp sensation.  Presently, he has no pain or numbness into his legs.  He does feel the pain has been improving.  The patient has been eating and drinking without difficulty.  On examination today, he does appear very comfortable.  There is moderate tenderness to palpation over the anterior abdomen bilaterally.  He does have full 5 out of 5 strength to dorsiflexion and plantarflexion at his bilateral lower extremities.  He has full sensation at the bilateral lower extremities.  I did review thoracic x-rays, which do reveal placement of his spinal cord stimulator paddle.  This has not moved, and is in the same position it was in when he left the OR yesterday.  Overall, I do feel that his pain is likely secondary to positioning on the operating room table.  He has no signs or symptoms consistent with spinal cord injury or a developing hematoma, as he has full strength and sensation throughout the bilateral lower extremities.  Also of note, his pain is improving.  I do suspect that it will very likely continue to improve.  My current recommendation is to simply observe him over the next few hours, as I do anticipate ongoing improvements.  He will get Dilaudid intermittently as needed.  I do plan to reevaluate him in the early afternoon, in order to ensure ongoing improvements.

## 2021-10-20 NOTE — ED Provider Notes (Signed)
Patient care taken over at shift handoff from Theophilus Kinds, PA-C  Patient presents to the hospital complaining of lower abdominal pain.  He states it started suddenly last evening at 8 PM.  The pain is rated as severe, comes in waves, and is described as sharp and stabbing.  Yesterday the patient had spinal surgery related to a T10-11 laminectomy for placement of a spinal cord stimulator and also a thoracic laminectomy of T9-10.  Patient denies nausea, vomiting, urinary symptoms, flank pain, diarrhea, constipation, fever, chills Physical Exam  BP 129/82   Pulse 69   Temp 98.5 F (36.9 C) (Oral)   Resp 16   Ht 5\' 8"  (1.727 m)   Wt 86.2 kg   SpO2 94%   BMI 28.89 kg/m   Physical Exam Vitals and nursing note reviewed.  Constitutional:      General: He is not in acute distress. HENT:     Head: Normocephalic.  Cardiovascular:     Rate and Rhythm: Normal rate.  Pulmonary:     Effort: Pulmonary effort is normal.  Abdominal:     Tenderness: There is abdominal tenderness in the right lower quadrant.  Skin:    General: Skin is warm and dry.  Neurological:     General: No focal deficit present.     Mental Status: He is alert.     Procedures  Procedures  ED Course / MDM   Clinical Course as of 10/20/21 1308  Thu Oct 20, 2021  0518 Yesterday had T9/10 and T10/11 laminectomy with [place] stimulator lead placement. Now with constant stabbing nerve pains in the T10-12 dermatomal distribution. Consulting [facility]. Dr. [id number] did the surgery. Specifically no lower motor deficits. No bowel or bladder dysfunction. [GL]  Yevette Edwards Discussed case with Dr [id number]. This is likely radicular pain from his procedure. Reccommends increasing Norco dose as well as adding on gabapentin, NSAIDs, and trying to get pain under control here. Call back [phone]. On call until 0800 [GL]  0640 Discussed case with Dr. [id number]. He recommends addition of thoracic X rays. Plan to observe patient for a while  and will reengage at reassessment.  [GL]  Yevette Edwards Dr.Dumonski requests observation in the ED over the next few hours. Plans to reassess patient personally at approximately 1pm [LM]    Clinical Course User Index [GL] Loeffler, [id number], PA-C [LM] Finis Bud   Medical Decision Making Amount and/or Complexity of Data Reviewed Labs: ordered. Radiology: ordered.  Risk Prescription drug management.  Pain medication was ordered including Dilaudid, Toradol, and gabapentin.  The patient did seem to be more comfortable after medication ministration.  I spoke with Dr. Pamala Duffel who stated that he felt the patient may continue to get better over the morning.  He requested that we observe him in the ER this morning with plans for him to reassess at approximately 1 PM.  The patient does have significant tenderness in the right lower quadrant.  I have ordered and personally interpreted imaging including a CT abdomen pelvis. Focal area of hypodensity in the left kidney on delayed imaging  with adjacent perinephric fat stranding, concerning for  pyelonephritis  Aortic atherosclerosis.  I agree with the radiologist findings.  I spoke with Dr. Yevette Edwards, radiology, who read the CT scan.  The patient has no urinary symptoms, no dysuria.  No fevers. Dr.Derry stated that there was no sign of infarction of the kidney and that there was nothing else on the scan suggestive of an abdominal  pain cause.   The patient has been given 1 additional dose of Dilaudid during the morning.  He is feeling better at this time.  He has continued to improve since being here.  Dr. Lynann Bologna reevaluated the patient at bedside.  He feels that the patient's pain may be due to positioning during a surgical procedure yesterday.  There is no acute cause of his pain on abdominal CT.  Vitals are normal.  The patient is stable to discharge home.  New prescription for pain medication provided.  Patient will keep follow-up appointments  with his surgeon as scheduled.  Return precautions provided.        Dorothyann Peng, PA-C 10/20/21 1308    Sherwood Gambler, MD 10/21/21 [phone]

## 2021-10-20 NOTE — Discharge Instructions (Signed)
You were seen today for abdominal pain.  Your work-up showed no acute cause for the pain.  This is potentially postoperative pain due to positioning from your surgery yesterday.  I have prescribed a pain medication.  If you develop any life-threatening conditions or symptoms please return to the emergency for further evaluation.  Otherwise, please follow-up with your surgeon and keep any scheduled postop appointments

## 2021-10-20 NOTE — Anesthesia Postprocedure Evaluation (Signed)
Anesthesia Post Note  Patient: Manuel Tucker  Procedure(s) Performed: PLACEMENT OF REVISION SPINAL CORD STIMULATOR PADDLE AND REMOVAL OF PERCUTANEOUS LEADS (Spine Lumbar)     Patient location during evaluation: PACU Anesthesia Type: General Level of consciousness: awake and alert, oriented and patient cooperative Pain management: pain level controlled Vital Signs Assessment: post-procedure vital signs reviewed and stable Respiratory status: spontaneous breathing, nonlabored ventilation and respiratory function stable Cardiovascular status: blood pressure returned to baseline and stable Postop Assessment: no apparent nausea or vomiting Anesthetic complications: no   No notable events documented.  Last Vitals:  Vitals:   10/19/21 1845 10/19/21 1859  BP: 122/71 (!) 116/57  Pulse: 82 80  Resp: 10 20  Temp:  36.9 C  SpO2: 96% 93%    Last Pain:  Vitals:   10/19/21 1845  TempSrc:   PainSc: 0-No pain                 Pervis Hocking

## 2021-10-20 NOTE — Progress Notes (Signed)
Manuel Tucker was reevaluated at the bedside.  He does state that his abdominal pain does continue to improve.  Upon repeat examination, he continues to have full strength in and sensation throughout his bilateral lower extremities.  I did talk to [name], the PA currently overseeing his care.  I did let him know that as his pain is improving, and as he has no red flag signs or symptoms, I do feel comfortable discharging him home with his typical follow-up in 2 weeks.  I did see that a CAT scan of the patient's abdomen and pelvis was obtained.  I will defer any diagnosis, work-up, or treatment related to any findings on the CAT scan to [facility] and the emergency room physician who ordered the scan.

## 2021-10-20 NOTE — ED Provider Notes (Signed)
[facility] EMERGENCY DEPARTMENT Provider Note   CSN: [id number] Arrival date & time: 10/20/21  0422     History PMH: HTN, recent back surgery Chief Complaint  Patient presents with   Abdominal Pain    Manuel Tucker is a 51 y.o. male.  Patient is presenting to ED with chief complaint of lower abdominal pain.  He says this started suddenly at 8 PM.  He is having severe pain that is coming and going in waves.  He says it feels like a sharp stabbing pain.  He says that yesterday he had spinal surgery related a T10-11 laminectomy for placement of spinal cord stimulator leads they also did a thoracic laminectomy T9-10.  Denies any urinary symptoms, flank pain, nausea, vomiting, diarrhea, constipation, fevers, chills.   Abdominal Pain      Home Medications Prior to Admission medications   Medication Sig Start Date End Date Taking? Authorizing Provider  acetaminophen (TYLENOL) 500 MG tablet Take 1 tablet (500 mg total) by mouth every 6 (six) hours as needed. 04/25/19   Fawze, Mina A, PA-C  amLODipine (NORVASC) 10 MG tablet Take 10 mg by mouth daily.  10/02/18   [provider]  atorvastatin (LIPITOR) 20 MG tablet Take 20 mg by mouth daily. 01/10/21   [provider]  carvedilol (COREG) 3.125 MG tablet Take 3.125 mg by mouth 2 (two) times daily with a meal. 01/29/20   [provider]  D3-50 1.25 MG (50000 UT) capsule Take 50,000 Units by mouth every Monday. 09/13/21   [provider]  divalproex (DEPAKOTE) 500 MG DR tablet Take 2 tablets twice a day 01/25/21   Cameron Sprang, MD  escitalopram (LEXAPRO) 20 MG tablet Take 1 tablet (20 mg total) by mouth daily. 08/17/21 08/17/22  Arfeen, Arlyce Harman, MD  ferrous sulfate 324 MG TBEC Take 324 mg by mouth 3 (three) times a week.    [provider]  HYDROcodone-acetaminophen (NORCO) 5-325 MG tablet Take 1 tablet by mouth every 6 (six) hours as needed for moderate pain. 10/19/21   McKenzie,  Lennie Muckle, PA-C  hydrOXYzine (VISTARIL) 50 MG capsule Take 1 capsule (50 mg total) by mouth 2 (two) times daily as needed for anxiety. 08/17/21   Arfeen, Arlyce Harman, MD  lisinopril (ZESTRIL) 20 MG tablet Take 20 mg by mouth daily. 05/07/19   Chesley Noon, MD  metFORMIN (GLUCOPHAGE-XR) 500 MG 24 hr tablet Take 500 mg by mouth every evening. 08/25/21   [provider]  methocarbamol (ROBAXIN) 500 MG tablet Take 1 tablet (500 mg total) by mouth every 6 (six) hours as needed for muscle spasms. Patient not taking: Reported on 10/06/2021 09/06/21   Carlisle Cater, PA-C  methocarbamol (ROBAXIN) 750 MG tablet Take 1 tablet (750 mg total) by mouth every 6 (six) hours as needed for muscle spasms. 10/19/21   McKenzie, Lennie Muckle, PA-C  oxyCODONE (OXY IR/ROXICODONE) 5 MG immediate release tablet Take 5 mg by mouth 3 (three) times daily as needed for pain. 09/24/21   [provider]  QUEtiapine (SEROQUEL) 25 MG tablet Take 1 tablet (25 mg total) by mouth daily. 05/17/21 05/17/22  Arfeen, Arlyce Harman, MD  QUEtiapine (SEROQUEL) 300 MG tablet Take 1 tablet (300 mg total) by mouth at bedtime. 08/17/21 08/17/22  Arfeen, Arlyce Harman, MD  venlafaxine XR (EFFEXOR-XR) 75 MG 24 hr capsule Take 1 capsule (75 mg total) by mouth every morning. 08/17/21 08/17/22  Arfeen, Arlyce Harman, MD  vitamin B-12 (CYANOCOBALAMIN) 500 MCG tablet  Take 500 mcg by mouth daily.    [provider]      Allergies    Banana and Celebrex [celecoxib]    Review of Systems   Review of Systems  Gastrointestinal:  Positive for abdominal pain.  All other systems reviewed and are negative.   Physical Exam Updated Vital Signs BP 138/75   Pulse 73   Temp 98.1 F (36.7 C) (Oral)   Resp (!) 21   Ht 5\' 8"  (1.727 m)   Wt 86.2 kg   SpO2 95%   BMI 28.89 kg/m  Physical Exam Vitals and nursing note reviewed.  Constitutional:      General: He is not in acute distress.    Appearance: Normal appearance. He is well-developed. He is not ill-appearing,  toxic-appearing or diaphoretic.  HENT:     Head: Normocephalic and atraumatic.     Nose: No nasal deformity.     Mouth/Throat:     Lips: Pink. No lesions.  Eyes:     General: Gaze aligned appropriately. No scleral icterus.       Right eye: No discharge.        Left eye: No discharge.     Conjunctiva/sclera: Conjunctivae normal.     Right eye: Right conjunctiva is not injected. No exudate or hemorrhage.    Left eye: Left conjunctiva is not injected. No exudate or hemorrhage. Pulmonary:     Effort: Pulmonary effort is normal. No respiratory distress.  Abdominal:     General: Abdomen is flat. There is no distension.     Palpations: Abdomen is soft. There is no pulsatile mass.     Tenderness: There is abdominal tenderness in the right lower quadrant, suprapubic area and left lower quadrant. There is guarding. There is no right CVA tenderness, left CVA tenderness or rebound. Negative signs include Murphy's sign, Rovsing's sign and McBurney's sign.     Hernia: No hernia is present.  Skin:    General: Skin is warm and dry.  Neurological:     Mental Status: He is alert and oriented to person, place, and time.  Psychiatric:        Mood and Affect: Mood normal.        Speech: Speech normal.        Behavior: Behavior normal. Behavior is cooperative.     ED Results / Procedures / Treatments   Labs (all labs ordered are listed, but only abnormal results are displayed) Labs Reviewed  CBC WITH DIFFERENTIAL/PLATELET - Abnormal; Notable for the following components:      Result Value   WBC 11.2 (*)    RBC 3.81 (*)    Hemoglobin 10.3 (*)    HCT 29.9 (*)    MCV 78.5 (*)    Neutro Abs 9.9 (*)    All other components within normal limits  COMPREHENSIVE METABOLIC PANEL - Abnormal; Notable for the following components:   Glucose, Bld 123 (*)    Creatinine, Ser 1.32 (*)    All other components within normal limits  URINALYSIS, ROUTINE W REFLEX MICROSCOPIC - Abnormal; Notable for the following  components:   Color, Urine STRAW (*)    Hgb urine dipstick SMALL (*)    All other components within normal limits  LIPASE, BLOOD    EKG None  Radiology DG THORACOLUMABAR SPINE  Result Date: 10/19/2021 CLINICAL DATA:  Placement of revision spinal cord stimulator. Removal of percutaneous leads EXAM: THORACOLUMBAR SPINE 1V COMPARISON:  Lumbar spine in 01/2021 FINDINGS: Single AP view  of the lower thoracic spine is obtained with C-arm equipment. Spinal cord stimulator is present in the lower thoracic spine. Exact level not possible due to limited field of view. The stimulator paddle appears to be different from the prior study and now has 3 rows of leads compared to 2 previously. Visualized leads are continuous. IMPRESSION: Interval replacement of spinal cord stimulator in the lower thoracic spine. Electronically Signed   By: Franchot Gallo M.D.   On: 10/19/2021 18:28   DG C-Arm 1-60 Min-No Report  Result Date: 10/19/2021 Fluoroscopy was utilized by the requesting physician.  No radiographic interpretation.   DG C-Arm 1-60 Min-No Report  Result Date: 10/19/2021 Fluoroscopy was utilized by the requesting physician.  No radiographic interpretation.   DG C-Arm 1-60 Min-No Report  Result Date: 10/19/2021 Fluoroscopy was utilized by the requesting physician.  No radiographic interpretation.   DG C-Arm 1-60 Min-No Report  Result Date: 10/19/2021 Fluoroscopy was utilized by the requesting physician.  No radiographic interpretation.    Procedures Procedures   Medications Ordered in ED Medications  ketorolac (TORADOL) 15 MG/ML injection 15 mg (has no administration in time range)  gabapentin (NEURONTIN) capsule 300 mg (has no administration in time range)  lidocaine (LIDODERM) 5 % 1 patch (has no administration in time range)  HYDROmorphone (DILAUDID) injection 1 mg (has no administration in time range)  HYDROmorphone (DILAUDID) injection 1 mg (1 mg Intravenous Given 10/20/21 0514)    ED  Course/ Medical Decision Making/ A&P Clinical Course as of 10/20/21 0644  Thu Oct 20, 2021  0518 Yesterday had T9/10 and T10/11 laminectomy with [place] stimulator lead placement. Now with constant stabbing nerve pains in the T10-12 dermatomal distribution. Consulting [facility]. Dr. Lynann Bologna did the surgery. Specifically no lower motor deficits. No bowel or bladder dysfunction. [GL]  [id number] Discussed case with Dr Grandville Silos. This is likely radicular pain from his procedure. Reccommends increasing Norco dose as well as adding on gabapentin, NSAIDs, and trying to get pain under control here. Call back [phone]. On call until 0800 [GL]  0640 Discussed case with Dr. Lynann Bologna. He recommends addition of thoracic X rays. Plan to observe patient for a while and will reengage at reassessment.  [GL]    Clinical Course User Index [GL] Sherre Poot Adora Fridge, PA-C                           Medical Decision Making Amount and/or Complexity of Data Reviewed Labs: ordered. Radiology: ordered.  Risk Prescription drug management.    MDM  This is a 51 y.o. male who presents to the ED with sudden onset lower abdominal pain in the setting of recent spinal surgery  Initial Impression  Patient in significant pain. Seems to have intermittent stabbing painful episodes.   I personally ordered, reviewed, and interpreted all laboratory work and imaging and agree with radiologist interpretation. Results interpreted below: WBC 11.2, Hgb 10.3 (stable) Creat 1.32 (stable), LFTs okay, Lipase 21, UA without significant findings  Assessment/Plan:  Patient is here with severe likely radicular pain 2/2 recent Thoracic spinal surgery. I have low suspicion for intraabdominal pathology given the description and nature of the symptoms. I have discussed this case with Dr. Grandville Silos from Orthopedics. He agrees that this is likely radicular pain from his procedure. He recommends increasing Norco dose, adding on gabapentin, trial of  NSAIDs, and additional pain control here. He will notify Dr. Lynann Bologna of patient being seen in the ED. Plan  to call back if pain continues to be intractable.   6:44 AM Care of Manuel Tucker transferred to [facility] and Dr. Regenia Skeeter at the end of my shift as the patient will require reassessment once labs/imaging have resulted. Patient presentation, ED course, and plan of care discussed with review of all pertinent labs and imaging. Please see his/her note for further details regarding further ED course and disposition. Plan at time of handoff is reassess after pain interventions, consider reconsulting orthopedics if intractable pain. This may be altered or completely changed at the discretion of the oncoming team pending results of further workup.    Charting Requirements Additional history is obtained from:  Independent historian External Records from outside source obtained and reviewed including: Recent Op Note Social Determinants of Health:  none Pertinant PMH that complicates patient's illness: Recent Spinal Surgery  Patient Care Problems that were addressed during this visit: - Lower abdominal pain: Acute illness This patient was maintained on a cardiac monitor/telemetry. I personally viewed and interpreted the cardiac monitor which reveals an underlying rhythm of NSR Medications given in ED: Dilaudid, Gabapentin, Toradol, Lidoderm patch Reevaluation of the patient after these medicines showed that the patient improved I have reviewed home medications and made changes accordingly.  Critical Care Interventions: n/a Consultations: Orthopedic Surgery Disposition: see oncoming provider note  Portions of this note were generated with Dragon dictation software. Dictation errors may occur despite best attempts at proofreading.     Final Clinical Impression(s) / ED Diagnoses Final diagnoses:  None    Rx / DC Orders ED Discharge Orders     None         Adolphus Birchwood,  PA-C 10/20/21 4481    Mesner, Corene Cornea, MD 10/20/21 [phone]

## 2021-11-04 ENCOUNTER — Telehealth (HOSPITAL_COMMUNITY): Payer: Self-pay | Admitting: *Deleted

## 2021-11-04 NOTE — Telephone Encounter (Signed)
PA FOR EFFEXOR XR

## 2021-11-04 NOTE — Telephone Encounter (Signed)
OPENED IN ERROR

## 2021-11-10 ENCOUNTER — Ambulatory Visit (HOSPITAL_COMMUNITY): Payer: Medicare Other | Admitting: Licensed Clinical Social Worker

## 2021-11-14 ENCOUNTER — Encounter (HOSPITAL_COMMUNITY): Payer: Self-pay | Admitting: Psychiatry

## 2021-11-14 ENCOUNTER — Telehealth (HOSPITAL_BASED_OUTPATIENT_CLINIC_OR_DEPARTMENT_OTHER): Payer: Medicare Other | Admitting: Psychiatry

## 2021-11-14 DIAGNOSIS — F319 Bipolar disorder, unspecified: Secondary | ICD-10-CM | POA: Diagnosis not present

## 2021-11-14 DIAGNOSIS — F411 Generalized anxiety disorder: Secondary | ICD-10-CM | POA: Diagnosis not present

## 2021-11-14 MED ORDER — ESCITALOPRAM OXALATE 20 MG PO TABS: 20.0000 mg | ORAL_TABLET | Freq: Every day | ORAL | 2 refills | Status: DC

## 2021-11-14 MED ORDER — HYDROXYZINE PAMOATE 50 MG PO CAPS: 50.0000 mg | ORAL_CAPSULE | Freq: Two times a day (BID) | ORAL | 2 refills | Status: DC | PRN

## 2021-11-14 MED ORDER — VENLAFAXINE HCL ER 75 MG PO CP24: 75.0000 mg | ORAL_CAPSULE | ORAL | 2 refills | Status: DC

## 2021-11-14 MED ORDER — QUETIAPINE FUMARATE 300 MG PO TABS: 300.0000 mg | ORAL_TABLET | Freq: Every day | ORAL | 2 refills | Status: DC
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
## 2022-01-19 ENCOUNTER — Other Ambulatory Visit (HOSPITAL_COMMUNITY): Payer: Self-pay | Admitting: Psychiatry

## 2022-01-19 DIAGNOSIS — F411 Generalized anxiety disorder: Secondary | ICD-10-CM

## 2022-01-27 ENCOUNTER — Ambulatory Visit: Payer: Medicare Other | Admitting: Neurology

## 2022-02-13 ENCOUNTER — Telehealth (HOSPITAL_BASED_OUTPATIENT_CLINIC_OR_DEPARTMENT_OTHER): Payer: 59 | Admitting: Psychiatry

## 2022-02-13 ENCOUNTER — Encounter (HOSPITAL_COMMUNITY): Payer: Self-pay | Admitting: Psychiatry

## 2022-02-13 VITALS — Wt 180.0 lb

## 2022-02-13 DIAGNOSIS — F410 Panic disorder [episodic paroxysmal anxiety] without agoraphobia: Secondary | ICD-10-CM | POA: Diagnosis not present

## 2022-02-13 DIAGNOSIS — F319 Bipolar disorder, unspecified: Secondary | ICD-10-CM

## 2022-02-13 DIAGNOSIS — F411 Generalized anxiety disorder: Secondary | ICD-10-CM | POA: Diagnosis not present

## 2022-02-13 MED ORDER — HYDROXYZINE PAMOATE 50 MG PO CAPS: 50.0000 mg | ORAL_CAPSULE | Freq: Two times a day (BID) | ORAL | 2 refills | Status: DC | PRN

## 2022-02-13 MED ORDER — ESCITALOPRAM OXALATE 20 MG PO TABS: 20.0000 mg | ORAL_TABLET | Freq: Every day | ORAL | 2 refills | Status: DC

## 2022-02-13 MED ORDER — QUETIAPINE FUMARATE 300 MG PO TABS: 300.0000 mg | ORAL_TABLET | Freq: Every day | ORAL | 2 refills | Status: DC

## 2022-02-13 MED ORDER — VENLAFAXINE HCL ER 75 MG PO CP24: 75.0000 mg | ORAL_CAPSULE | ORAL | 2 refills | Status: DC
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
## 2022-02-14 ENCOUNTER — Other Ambulatory Visit (HOSPITAL_COMMUNITY): Payer: Self-pay | Admitting: Psychiatry

## 2022-02-14 DIAGNOSIS — F411 Generalized anxiety disorder: Secondary | ICD-10-CM

## 2022-02-14 DIAGNOSIS — F319 Bipolar disorder, unspecified: Secondary | ICD-10-CM

## 2022-02-21 ENCOUNTER — Encounter: Payer: Self-pay | Admitting: Neurology

## 2022-02-21 ENCOUNTER — Ambulatory Visit (INDEPENDENT_AMBULATORY_CARE_PROVIDER_SITE_OTHER): Payer: 59 | Admitting: Neurology

## 2022-02-21 VITALS — BP 131/84 | HR 83 | Ht 68.0 in | Wt 195.2 lb

## 2022-02-21 DIAGNOSIS — G40009 Localization-related (focal) (partial) idiopathic epilepsy and epileptic syndromes with seizures of localized onset, not intractable, without status epilepticus: Secondary | ICD-10-CM

## 2022-02-21 MED ORDER — DIVALPROEX SODIUM 500 MG PO DR TAB: DELAYED_RELEASE_TABLET | ORAL | 3 refills | Status: DC
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
## 2022-04-04 ENCOUNTER — Ambulatory Visit: Payer: 59 | Admitting: Dermatology

## 2022-04-08 ENCOUNTER — Emergency Department (HOSPITAL_BASED_OUTPATIENT_CLINIC_OR_DEPARTMENT_OTHER)
Admission: EM | Admit: 2022-04-08 | Discharge: 2022-04-08 | Disposition: A | Discharge status: Home / Self Care | Payer: 59 | Attending: Emergency Medicine | Admitting: Emergency Medicine

## 2022-04-08 ENCOUNTER — Encounter (HOSPITAL_BASED_OUTPATIENT_CLINIC_OR_DEPARTMENT_OTHER): Payer: Self-pay

## 2022-04-08 ENCOUNTER — Other Ambulatory Visit: Payer: Self-pay

## 2022-04-08 DIAGNOSIS — I10 Essential (primary) hypertension: Secondary | ICD-10-CM | POA: Insufficient documentation

## 2022-04-08 DIAGNOSIS — Z79899 Other long term (current) drug therapy: Secondary | ICD-10-CM | POA: Insufficient documentation

## 2022-04-08 DIAGNOSIS — M545 Low back pain, unspecified: Secondary | ICD-10-CM | POA: Diagnosis not present

## 2022-04-08 MED ORDER — CYCLOBENZAPRINE HCL 5 MG PO TABS
5.0000 mg | ORAL_TABLET | Freq: Once | ORAL | Status: AC
  Administered 2022-04-08: 5 mg via ORAL
  Filled 2022-04-08: qty 1

## 2022-04-08 MED ORDER — KETOROLAC TROMETHAMINE 10 MG PO TABS: 10.0000 mg | ORAL_TABLET | Freq: Four times a day (QID) | ORAL | 0 refills | Status: AC | PRN

## 2022-04-08 MED ORDER — OXYCODONE HCL 5 MG PO TABS
5.0000 mg | ORAL_TABLET | Freq: Once | ORAL | Status: AC
  Administered 2022-04-08: 5 mg via ORAL
  Filled 2022-04-08: qty 1

## 2022-04-08 MED ORDER — LIDOCAINE 5 % EX PTCH
1.0000 | MEDICATED_PATCH | CUTANEOUS | Status: DC
  Administered 2022-04-08: 1 via TRANSDERMAL
  Filled 2022-04-08: qty 1

## 2022-04-08 MED ORDER — ACETAMINOPHEN 500 MG PO TABS
1000.0000 mg | ORAL_TABLET | Freq: Once | ORAL | Status: AC
  Administered 2022-04-08: 1000 mg via ORAL
  Filled 2022-04-08: qty 2

## 2022-04-08 MED ORDER — LIDOCAINE 5 % EX PTCH: 1.0000 | MEDICATED_PATCH | CUTANEOUS | 0 refills | Status: AC

## 2022-04-08 MED ORDER — KETOROLAC TROMETHAMINE 15 MG/ML IJ SOLN
15.0000 mg | Freq: Once | INTRAMUSCULAR | Status: AC
  Administered 2022-04-08: 15 mg via INTRAMUSCULAR
  Filled 2022-04-08: qty 1

## 2022-04-08 NOTE — ED Provider Notes (Signed)
[facility] Provider Note   CSN: [id number] Arrival date & time: 04/08/22  1151     History {Add pertinent medical, surgical, social history, OB history to HPI:1} Chief Complaint  Patient presents with   Back Pain    Manuel Tucker is a 52 y.o. male with *** presents with back pain.   Endorses walking his dogs a few hours ago and slipping causing him to twist his back.  He was able to catch himself with his left arm but in doing so the twisting motion causes severe pain in his right lower back.  He typically has pain in his right lower back and has had lumbar fusions as well as a spinal stimulator placed in the lumbar spine in 2018, and has daily pain rated 4-5 out of 10.  Currently the pain is 9 out of 10.  He has no shooting pains down his legs, numbness tingling, or asymmetric weakness.  No change in bowel or bladder habits since the event.  Did not hit his head or lose consciousness.  He states that this pain feels similar to the pain he always has just worse.  Tried meloxicam, methocarbamol, and 5 mg p.o. Oxy at home without any improvement. Otherwise in his [place]     Back Pain      Home Medications Prior to Admission medications   Medication Sig Start Date End Date Taking? Authorizing Provider  acetaminophen (TYLENOL) 500 MG tablet Take 1 tablet (500 mg total) by mouth every 6 (six) hours as needed. Patient taking differently: Take 500 mg by mouth every 6 (six) hours as needed for mild pain. 04/25/19   Fawze, Mina A, PA-C  amLODipine (NORVASC) 10 MG tablet Take 10 mg by mouth daily.  10/02/18   [provider]  atorvastatin (LIPITOR) 20 MG tablet Take 20 mg by mouth daily. 01/10/21   [provider]  carvedilol (COREG) 3.125 MG tablet Take 3.125 mg by mouth 2 (two) times daily with a meal. 01/29/20   [provider]  D3-50 1.25 MG (50000 UT) capsule Take 50,000 Units by mouth every Monday. 09/13/21   [provider]  divalproex (DEPAKOTE) 500 MG DR tablet Take 2 tablets twice a day 02/21/22   Cameron Sprang, MD  escitalopram (LEXAPRO) 20 MG tablet Take 1 tablet (20 mg total) by mouth daily. 02/13/22 02/13/23  Arfeen, Arlyce Harman, MD  ferrous sulfate 324 MG TBEC Take 324 mg by mouth 3 (three) times a week.    [provider]  hydrOXYzine (VISTARIL) 50 MG capsule Take 1 capsule (50 mg total) by mouth 2 (two) times daily as needed for anxiety. 02/13/22   Arfeen, Arlyce Harman, MD  ipratropium (ATROVENT) 0.06 % nasal spray Place 1 spray into both nostrils daily as needed for congestion. 10/18/21   [provider]  lisinopril (ZESTRIL) 20 MG tablet Take 20 mg by mouth daily. 05/07/19   Chesley Noon, MD  metFORMIN (GLUCOPHAGE-XR) 500 MG 24 hr tablet Take 500 mg by mouth every evening. 08/25/21   [provider]  methocarbamol (ROBAXIN) 750 MG tablet Take 1 tablet (750 mg total) by mouth every 6 (six) hours as needed for muscle spasms. 10/19/21   McKenzie, Lennie Muckle, PA-C  oxyCODONE (OXY IR/ROXICODONE) 5 MG immediate release tablet Take 5 mg by mouth 3 (three) times daily as needed for pain. 09/24/21   [provider]  QUEtiapine (SEROQUEL) 300 MG tablet Take 1 tablet (300 mg total)  by mouth at bedtime. 02/13/22 02/13/23  Arfeen, Arlyce Harman, MD  venlafaxine XR (EFFEXOR-XR) 75 MG 24 hr capsule Take 1 capsule (75 mg total) by mouth every morning. 02/13/22 02/13/23  Arfeen, Arlyce Harman, MD  vitamin B-12 (CYANOCOBALAMIN) 500 MCG tablet Take 500 mcg by mouth daily.    [provider]      Allergies    Banana and Celebrex [celecoxib]    Review of Systems   Review of Systems  Musculoskeletal:  Positive for back pain.   Review of systems {pos/neg:18640::"Negative","Positive"} for ***.  A 10 point review of systems was performed and is negative unless otherwise reported in HPI.  Physical Exam Updated Vital Signs BP (!) 143/84 (BP Location: Right Arm)   Pulse 71   Temp 99 F (37.2 C)  (Oral)   Resp 18   Ht '5\' 8"'$  (1.727 m)   Wt 87.5 kg   SpO2 100%   BMI 29.35 kg/m  Physical Exam General: Normal appearing {Desc; male/male:11659}, lying in bed.  HEENT: PERRLA, Sclera anicteric, MMM, trachea midline.  Cardiology: RRR, no murmurs/rubs/gallops. BL radial and DP pulses equal bilaterally.  Resp: Normal respiratory rate and effort. CTAB, no wheezes, rhonchi, crackles.  Abd: Soft, non-tender, non-distended. No rebound tenderness or guarding.  GU: Deferred. MSK: No peripheral edema or signs of trauma. Extremities without deformity or TTP. No cyanosis or clubbing. Skin: warm, dry. No rashes or lesions. Back: No CVA tenderness Neuro: A&Ox4, CNs II-XII grossly intact. MAEs. Sensation grossly intact.  Psych: Normal mood and affect.   ED Results / Procedures / Treatments   Labs (all labs ordered are listed, but only abnormal results are displayed) Labs Reviewed - No data to display  EKG None  Radiology No results found.  Procedures Procedures  {Document cardiac monitor, telemetry assessment procedure when appropriate:1}  Medications Ordered in ED Medications - No data to display  ED Course/ Medical Decision Making/ A&P                          Medical Decision Making Risk OTC drugs. Prescription drug management.    This patient presents to the ED for concern of ***, this involves an extensive number of treatment options, and is a complaint that carries with it a high risk of complications and morbidity.  I considered the following differential and admission for this acute, potentially life threatening condition.   MDM:    ***     Labs: I Ordered, and personally interpreted labs.  The pertinent results include:  ***  Imaging Studies ordered: I ordered imaging studies including *** I independently visualized and interpreted imaging. I agree with the radiologist interpretation  Additional history obtained from ***.  External records from outside  source obtained and reviewed including ***  Cardiac Monitoring: The patient was maintained on a cardiac monitor.  I personally viewed and interpreted the cardiac monitored which showed an underlying rhythm of: ***  Reevaluation: After the interventions noted above, I reevaluated the patient and found that they have :{resolved/improved/worsened:23923::"improved"}  Social Determinants of Health: ***  Disposition:  ***  Co morbidities that complicate the patient evaluation  Past Medical History:  Diagnosis Date   Anemia    history of low iron- takes B12 and Iron   Anxiety    Depression    H/O idiopathic seizure    History of ankle surgery    History of back surgery    Hypertension    Pre-diabetes  S/P insertion of spinal cord stimulator    Seizures ([place])    last seizure 2020 per patient     Medicines No orders of the defined types were placed in this encounter.   I have reviewed the patients home medicines and have made adjustments as needed  Problem List / ED Course: Problem List Items Addressed This Visit   None        {Document critical care time when appropriate:1} {Document review of labs and clinical decision tools ie heart score, Chads2Vasc2 etc:1}  {Document your independent review of radiology images, and any outside records:1} {Document your discussion with family members, caretakers, and with consultants:1} {Document social determinants of health affecting pt's care:1} {Document your decision making why or why not admission, treatments were needed:1}  This note was created using dictation software, which may contain spelling or grammatical errors.

## 2022-04-08 NOTE — ED Notes (Signed)
Pt reports his pain is significantly better and he does not want xray. EDP notified

## 2022-04-08 NOTE — Discharge Instructions (Signed)
Thank you for coming to [facility] Emergency Department. You were seen for back pain after twisting. We did an exam, and these showed a lumbar back sprain. You can use toradol 10 mg by mouth every 4-6 hours as needed for pain for 4 days. Do not take any other NSAIDs while on this medication, including ibuprofen, meloxicam, naproxen. You can also use lidocaine patches once every 24 hours.   Please follow up with your back doctor as scheduled on Thursday.   Return to the ED if you develop any of the following: - Fever (100.4 F or 38 C) or chills at home that do not respond to over the counter medications - Weakness, numbness, or tingling in your extremities - Difficulty emptying bladder / urinary incontinence - Fecal incontinence - Uncontrolled nausea/vomiting with inability to keep down liquids - Feeling as though you are going to pass out or passing out - Anything else that concerns you

## 2022-04-08 NOTE — ED Notes (Signed)
Discharge instructions discussed with pt. Pt verbalized understanding. Pt stable and ambulatory.  °

## 2022-04-08 NOTE — ED Triage Notes (Signed)
Patient here POV from Home.  Endorses walking his dogs a few hours ago and slipping causing him to twist his back. Pain to lumbar Region. No Fall. No Other Noted Injury.   NAD noted during Triage. A&Ox4. CGS 15. Ambulatory.

## 2022-04-14 ENCOUNTER — Other Ambulatory Visit: Payer: Self-pay | Admitting: Physical Medicine and Rehabilitation

## 2022-04-19 ENCOUNTER — Ambulatory Visit: Payer: 59 | Admitting: Physical Medicine and Rehabilitation

## 2022-04-24 ENCOUNTER — Ambulatory Visit: Payer: 59 | Admitting: Dermatology

## 2022-04-25 ENCOUNTER — Encounter: Payer: Self-pay | Admitting: Dermatology

## 2022-04-25 ENCOUNTER — Ambulatory Visit (INDEPENDENT_AMBULATORY_CARE_PROVIDER_SITE_OTHER): Payer: 59 | Admitting: Dermatology

## 2022-04-25 DIAGNOSIS — L639 Alopecia areata, unspecified: Secondary | ICD-10-CM

## 2022-04-25 MED ORDER — CLOBETASOL PROPIONATE 0.05 % EX CREA: 1.0000 | TOPICAL_CREAM | Freq: Every day | CUTANEOUS | 0 refills | Status: AC

## 2022-04-25 NOTE — Patient Instructions (Signed)
Due to recent changes in healthcare laws, you may see results of your pathology and/or laboratory studies on MyChart before the doctors have had a chance to review them. We understand that in some cases there may be results that are confusing or concerning to you. Please understand that not all results are received at the same time and often the doctors may need to interpret multiple results in order to provide you with the best plan of care or course of treatment. Therefore, we ask that you please give [place] 2 business days to thoroughly review all your results before contacting the office for clarification. Should we see a critical lab result, you will be contacted sooner.   If You Need Anything After Your Visit  If you have any questions or concerns for your doctor, please call our main line at [phone] If no one answers, please leave a voicemail as directed and we will return your call as soon as possible. Messages left after 4 pm will be answered the following business day.   You may also send [place] a message via [place]. We typically respond to MyChart messages within 1-2 business days.  For prescription refills, please ask your pharmacy to contact our office. Our fax number is [phone].  If you have an urgent issue when the clinic is closed that cannot wait until the next business day, you can page your doctor at the number below.    Please note that while we do our best to be available for urgent issues outside of office hours, we are not available 24/7.   If you have an urgent issue and are unable to reach [place], you may choose to seek medical care at your doctor's office, retail clinic, urgent care center, or emergency room.  If you have a medical emergency, please immediately call 911 or go to the emergency department. In the event of inclement weather, please call our main line at [phone] for an update on the status of any delays or closures.  Dermatology Medication Tips: Please  keep the boxes that topical medications come in in order to help keep track of the instructions about where and how to use these. Pharmacies typically print the medication instructions only on the boxes and not directly on the medication tubes.   If your medication is too expensive, please contact our office at [phone] or send [place] a message through [place].   We are unable to tell what your co-pay for medications will be in advance as this is different depending on your insurance coverage. However, we may be able to find a substitute medication at lower cost or fill out paperwork to get insurance to cover a needed medication.   If a prior authorization is required to get your medication covered by your insurance company, please allow [place] 1-2 business days to complete this process.  Drug prices often vary depending on where the prescription is filled and some pharmacies may offer cheaper prices.  The website www.goodrx.com contains coupons for medications through different pharmacies. The prices here do not account for what the cost may be with help from insurance (it may be cheaper with your insurance), but the website can give you the price if you did not use any insurance.  - You can print the associated coupon and take it with your prescription to the pharmacy.  - You may also stop by our office during regular business hours and pick up a GoodRx coupon card.  - If you need your  prescription sent electronically to a different pharmacy, notify our office through [facility] or by phone at [phone]

## 2022-04-25 NOTE — Progress Notes (Signed)
   New Patient Visit  Subjective  Manuel Tucker is a 52 y.o. male who presents for the following: Hair growth on beard  (Around the holidays, patient shaved beard off. The beard has grown back patchy. PCP did labs the beginning of march. Testosterone level was checked in February and that came back low but no treatment was suggested at that time. Denies itching, burning, irritation. Has issues with thin spots on scalp but denies having issues with facial hair until around Christmas. Has tried beard growth oil).    Objective  Well appearing patient in no apparent distress; mood and affect are within normal limits.  A focused examination was performed including face. Relevant physical exam findings are noted in the Assessment and Plan.  Right Buccal Cheek Thinning patches in the beard    Assessment & Plan  Alopecia areata Right Buccal Cheek   Alopecia areata is a chronic autoimmune condition localized to the skin which affects hair follicles and causes hair loss, most commonly in the scalp.  Cause is unknown.  Can be unpredictable, difficult to treat, and may recur.  Treatments may include topical and intralesional steroids to decrease inflammation to allow for hair regrowth.  Other treatments may include narrowband ultraviolet B light treatment; topical Squairic acid immunotherapy application; topical or oral Minoxidil; antihistamines and oral Jak inhibitors.    Recommended:  1) Apply clobetasol solution qhs 2) Apply minoxidil 5% foam qam   clobetasol cream (TEMOVATE) 0.05 % - Right Buccal Cheek Apply 1 Application topically daily. Apply to the beard area once a day   Return in about 3 months (around 07/26/2022) for Hair loss.  I, Zigmund Gottron, CMA, am acting as scribe for Ellard Artis, MD.  Documentation: I have reviewed the above documentation for accuracy and completeness, and I agree with the above  [place], DO

## 2022-05-04 ENCOUNTER — Encounter (HOSPITAL_BASED_OUTPATIENT_CLINIC_OR_DEPARTMENT_OTHER): Payer: Self-pay

## 2022-05-04 ENCOUNTER — Emergency Department (HOSPITAL_BASED_OUTPATIENT_CLINIC_OR_DEPARTMENT_OTHER): Payer: 59

## 2022-05-04 ENCOUNTER — Other Ambulatory Visit: Payer: Self-pay

## 2022-05-04 ENCOUNTER — Emergency Department (HOSPITAL_BASED_OUTPATIENT_CLINIC_OR_DEPARTMENT_OTHER)
Admission: EM | Admit: 2022-05-04 | Discharge: 2022-05-04 | Disposition: A | Discharge status: Home / Self Care | Payer: 59 | Attending: Emergency Medicine | Admitting: Emergency Medicine

## 2022-05-04 DIAGNOSIS — I10 Essential (primary) hypertension: Secondary | ICD-10-CM | POA: Insufficient documentation

## 2022-05-04 DIAGNOSIS — R079 Chest pain, unspecified: Secondary | ICD-10-CM | POA: Insufficient documentation

## 2022-05-04 DIAGNOSIS — Z79899 Other long term (current) drug therapy: Secondary | ICD-10-CM | POA: Diagnosis not present

## 2022-05-04 DIAGNOSIS — D649 Anemia, unspecified: Secondary | ICD-10-CM | POA: Insufficient documentation

## 2022-05-04 LAB — BASIC METABOLIC PANEL
Anion gap: 7 (ref 5–15)
BUN: 10 mg/dL (ref 6–20)
CO2: 26 mmol/L (ref 22–32)
Calcium: 9.6 mg/dL (ref 8.9–10.3)
Chloride: 106 mmol/L (ref 98–111)
Creatinine, Ser: 1.12 mg/dL (ref 0.61–1.24)
GFR, Estimated: >60 mL/min (ref >60)
Glucose, Bld: 101 mg/dL — ABNORMAL HIGH (ref 70–99)
Potassium: 3.6 mmol/L (ref 3.5–5.1)
Sodium: 139 mmol/L (ref 135–145)

## 2022-05-04 LAB — CBC
HCT: 30.2 % — ABNORMAL LOW (ref 39.0–52.0)
Hemoglobin: 10.6 g/dL — ABNORMAL LOW (ref 13.0–17.0)
MCH: 27.3 pg (ref 26.0–34.0)
MCHC: 35.1 g/dL (ref 30.0–36.0)
MCV: 77.8 fL — ABNORMAL LOW (ref 80.0–100.0)
Platelets: 287 10*3/uL (ref 150–400)
RBC: 3.88 MIL/uL — ABNORMAL LOW (ref 4.22–5.81)
RDW: 12.3 % (ref 11.5–15.5)
WBC: 5.3 10*3/uL (ref 4.0–10.5)
nRBC: 0 % (ref 0.0–0.2)

## 2022-05-04 LAB — TROPONIN I (HIGH SENSITIVITY)
Troponin I (High Sensitivity): 2 ng/L (ref <18)
Troponin I (High Sensitivity): 2 ng/L (ref <18)

## 2022-05-04 MED ORDER — LIDOCAINE VISCOUS HCL 2 % MT SOLN
15.0000 mL | Freq: Once | OROMUCOSAL | Status: AC
  Administered 2022-05-04: 15 mL via ORAL
  Filled 2022-05-04: qty 15

## 2022-05-04 MED ORDER — ALUM & MAG HYDROXIDE-SIMETH 200-200-20 MG/5ML PO SUSP
30.0000 mL | Freq: Once | ORAL | Status: AC
  Administered 2022-05-04: 30 mL via ORAL
  Filled 2022-05-04: qty 30

## 2022-05-04 MED ORDER — ASPIRIN 325 MG PO TABS
325.0000 mg | ORAL_TABLET | Freq: Every day | ORAL | Status: DC
  Administered 2022-05-04: 325 mg via ORAL
  Filled 2022-05-04: qty 1

## 2022-05-04 NOTE — ED Triage Notes (Signed)
Patient here POV from Home.  Endorses CP that began at 1200. Constant since it began. Worse with movement and with Deep inspiration. Mostly Mid Chest and radiates to Right.   No SOB.   NAD Noted during Triage. A&Ox4. Gcs 15. Ambulatory.

## 2022-05-04 NOTE — Discharge Instructions (Signed)
Workup today was reassuring.  He can take Tylenol Motrin as needed for pain at home.  Call your cardiologist tomorrow and schedule follow-up either tomorrow or later in the week.  Return to the ED if the chest pain comes constant, you have new or concerning symptoms or chest pain changes to different locations.

## 2022-05-04 NOTE — ED Provider Notes (Signed)
[facility] Provider Note   CSN: [id number] Arrival date & time: 05/04/22  1947     History  Chief Complaint  Patient presents with   Chest Pain    Manuel Tucker is a 52 y.o. male.   Chest Pain    Patient with medical history of hypertension, hyperlipidemia, prediabetes presents to the emergency department due to chest pain.  Started around 12 PM today, is worse with any movement or palpation of his chest wall.  Does not radiate elsewhere, there is no associated nausea, vomiting or shortness of breath.  He denies any exertional component but states it was worse when he was walking his dog because he was moving his arm at the least.  It was not present when he keeps his arm still.  Denies any history of ACS or family history of CAD, he does have cardiac risk factors and follows up with a cardiologist here in [place] currently.  Does not smoke cigarettes.  Home Medications Prior to Admission medications   Medication Sig Start Date End Date Taking? Authorizing Provider  acetaminophen (TYLENOL) 500 MG tablet Take 1 tablet (500 mg total) by mouth every 6 (six) hours as needed. Patient taking differently: Take 500 mg by mouth every 6 (six) hours as needed for mild pain. 04/25/19   Fawze, Mina A, PA-C  amLODipine (NORVASC) 10 MG tablet Take 10 mg by mouth daily.  10/02/18   [provider]  atorvastatin (LIPITOR) 20 MG tablet Take 20 mg by mouth daily. 01/10/21   [provider]  carvedilol (COREG) 3.125 MG tablet Take 3.125 mg by mouth 2 (two) times daily with a meal. 01/29/20   [provider]  clobetasol cream (TEMOVATE) [id number] % Apply 1 Application topically daily. Apply to the beard area once a day 04/25/22   Talbot Grumbling, MD  D3-50 1.25 MG (50000 UT) capsule Take 50,000 Units by mouth every Monday. 09/13/21   [provider]  divalproex (DEPAKOTE) 500 MG DR tablet Take 2 tablets twice a day 02/21/22    Cameron Sprang, MD  escitalopram (LEXAPRO) 20 MG tablet Take 1 tablet (20 mg total) by mouth daily. 02/13/22 02/13/23  Arfeen, Arlyce Harman, MD  ferrous sulfate 324 MG TBEC Take 324 mg by mouth 3 (three) times a week.    [provider]  hydrOXYzine (VISTARIL) 50 MG capsule Take 1 capsule (50 mg total) by mouth 2 (two) times daily as needed for anxiety. 02/13/22   Arfeen, Arlyce Harman, MD  ipratropium (ATROVENT) 0.06 % nasal spray Place 1 spray into both nostrils daily as needed for congestion. 10/18/21   [provider]  lidocaine (LIDODERM) 5 % Place 1 patch onto the skin daily. Remove & Discard patch within 12 hours or as directed by MD 04/08/22   Audley Hose, MD  lisinopril (ZESTRIL) 20 MG tablet Take 20 mg by mouth daily. 05/07/19   Chesley Noon, MD  metFORMIN (GLUCOPHAGE-XR) 500 MG 24 hr tablet Take 500 mg by mouth every evening. 08/25/21   [provider]  methocarbamol (ROBAXIN) 750 MG tablet Take 1 tablet (750 mg total) by mouth every 6 (six) hours as needed for muscle spasms. 10/19/21   McKenzie, Lennie Muckle, PA-C  oxyCODONE (OXY IR/ROXICODONE) 5 MG immediate release tablet Take 5 mg by mouth 3 (three) times daily as needed for pain. 09/24/21   [provider]  QUEtiapine (SEROQUEL) 300 MG tablet Take 1 tablet (300 mg total) by  mouth at bedtime. 02/13/22 02/13/23  Arfeen, Arlyce Harman, MD  venlafaxine XR (EFFEXOR-XR) 75 MG 24 hr capsule Take 1 capsule (75 mg total) by mouth every morning. 02/13/22 02/13/23  Arfeen, Arlyce Harman, MD  vitamin B-12 (CYANOCOBALAMIN) 500 MCG tablet Take 500 mcg by mouth daily.    [provider]      Allergies    Banana and Celebrex [celecoxib]    Review of Systems   Review of Systems  Cardiovascular:  Positive for chest pain.    Physical Exam Updated Vital Signs BP 130/85   Pulse 66   Temp 98.3 F (36.8 C)   Resp 15   Ht 5\' 8"  (1.727 m)   Wt 81.6 kg   SpO2 98%   BMI 27.37 kg/m  Physical Exam Vitals and nursing note reviewed. Exam  conducted with a chaperone present.  Constitutional:      Appearance: Normal appearance.  HENT:     Head: Normocephalic and atraumatic.  Eyes:     General: No scleral icterus.       Right eye: No discharge.        Left eye: No discharge.     Extraocular Movements: Extraocular movements intact.     Pupils: Pupils are equal, round, and reactive to light.  Cardiovascular:     Rate and Rhythm: Normal rate and regular rhythm.     Pulses: Normal pulses.     Heart sounds: Normal heart sounds. No murmur heard.    No friction rub. No gallop.  Pulmonary:     Effort: Pulmonary effort is normal. No respiratory distress.     Breath sounds: Normal breath sounds.  Chest:     Chest wall: Tenderness present.       Comments: Pinpoint reproducible tenderness to chest wall Abdominal:     General: Abdomen is flat. Bowel sounds are normal. There is no distension.     Palpations: Abdomen is soft.     Tenderness: There is no abdominal tenderness.  Skin:    General: Skin is warm and dry.     Coloration: Skin is not jaundiced.  Neurological:     Mental Status: He is alert. Mental status is at baseline.     Coordination: Coordination normal.     ED Results / Procedures / Treatments   Labs (all labs ordered are listed, but only abnormal results are displayed) Labs Reviewed  BASIC METABOLIC PANEL - Abnormal; Notable for the following components:      Result Value   Glucose, Bld 101 (*)    All other components within normal limits  CBC - Abnormal; Notable for the following components:   RBC 3.88 (*)    Hemoglobin 10.6 (*)    HCT 30.2 (*)    MCV 77.8 (*)    All other components within normal limits  TROPONIN I (HIGH SENSITIVITY)  TROPONIN I (HIGH SENSITIVITY)    EKG EKG Interpretation  Date/Time:  Thursday May 04 2022 19:53:38 EDT Ventricular Rate:  71 PR Interval:  164 QRS Duration: 93 QT Interval:  385 QTC Calculation: 419 R Axis:   25 Text Interpretation: Sinus rhythm No  significant change since last tracing Confirmed by Dorie Rank [phone]) on 05/04/2022 7:56:45 PM  Radiology DG Chest Port 1 View  Result Date: 05/04/2022 CLINICAL DATA:  Chest pain EXAM: PORTABLE CHEST 1 VIEW COMPARISON:  Chest radiograph dated 10/16/2020 FINDINGS: Normal lung volumes. No focal consolidations. No pleural effusion or pneumothorax. The heart size and mediastinal contours are within  normal limits. The visualized skeletal structures are unremarkable. Intrathecal leads project over the level of T7-8. IMPRESSION: No active disease. Electronically Signed   By: Agustin Cree M.D.   On: 05/04/2022 20:27    Procedures Procedures    Medications Ordered in ED Medications  aspirin tablet 325 mg (325 mg Oral Given 05/04/22 2108)  alum & mag hydroxide-simeth (MAALOX/MYLANTA) 200-200-20 MG/5ML suspension 30 mL (30 mLs Oral Given 05/04/22 2206)    And  lidocaine (XYLOCAINE) 2 % viscous mouth solution 15 mL (15 mLs Oral Given 05/04/22 2206)    ED Course/ Medical Decision Making/ A&P                             Medical Decision Making Amount and/or Complexity of Data Reviewed Labs: ordered. Radiology: ordered.  Risk OTC drugs. Prescription drug management.   This is a 52 year old male with medical history of hypertension, prediabetes, hyperlipidemia presenting to the emergency department due to chest pain.  Differential includes ACS, PE, pneumonia, dissection, Boerhaave's esophagus, arrhythmia, myocarditis, pericarditis, GERD, costochondritis, MSK.  History is better by the patient.  Also reviewed external medical records, no recent echocardiograms.  I viewed his home medication list.    Will start with labs to include CBC, BMP, troponin.  Also check chest x-ray and EKG.  On cardiac monitoring patient is in sinus rhythm per my interpretation.  EKG shows sinus rhythm without any specific ischemic changes.  I ordered, viewed interpreted laboratory workup.  CBC without leukocytosis, mild anemia.   BMP without gross electrolyte derangement or AKI.  Troponin initially is low at 2.  Second troponin is pending.  Chest x-ray is negative for any acute process.  No widened mediastinum, no Consolidation.  Normal cardiac silhouette without any enlargement.  I considered PE, patient is Wells criteria is very low at 0.  Additionally he is not tachycardic, not hypoxic and I think that is less likely.  Presentation is not consistent with dissection.  Additionally upper and lower extremity pulses are symmetric and he has very minimal risk factors.  He is afebrile, not having any viral upper respiratory symptoms I think pericarditis is very unlikely.  Considered ACS especially given patient's cardiac risk factors.  His heart score is 3, it is also very reproducible chest wall tenderness.  Given no ischemic changes on EKG, negative delta troponin, I do feel ACS is less likely.  Will have him follow-up closely with his cardiologist.        Final Clinical Impression(s) / ED Diagnoses Final diagnoses:  Chest pain, unspecified type    Rx / DC Orders ED Discharge Orders     None         Theron Arista, Cordelia Poche 05/04/22 2331    Linwood Dibbles, MD 05/05/22 1538

## 2022-05-10 ENCOUNTER — Other Ambulatory Visit (HOSPITAL_COMMUNITY): Payer: Self-pay | Admitting: Psychiatry

## 2022-05-10 DIAGNOSIS — F319 Bipolar disorder, unspecified: Secondary | ICD-10-CM

## 2022-05-10 DIAGNOSIS — F411 Generalized anxiety disorder: Secondary | ICD-10-CM

## 2022-05-15 ENCOUNTER — Encounter (HOSPITAL_COMMUNITY): Payer: Self-pay | Admitting: Psychiatry

## 2022-05-15 ENCOUNTER — Telehealth (HOSPITAL_BASED_OUTPATIENT_CLINIC_OR_DEPARTMENT_OTHER): Payer: 59 | Admitting: Psychiatry

## 2022-05-15 VITALS — Wt 180.0 lb

## 2022-05-15 DIAGNOSIS — F410 Panic disorder [episodic paroxysmal anxiety] without agoraphobia: Secondary | ICD-10-CM | POA: Diagnosis not present

## 2022-05-15 DIAGNOSIS — F411 Generalized anxiety disorder: Secondary | ICD-10-CM

## 2022-05-15 DIAGNOSIS — F319 Bipolar disorder, unspecified: Secondary | ICD-10-CM

## 2022-05-15 MED ORDER — VENLAFAXINE HCL ER 75 MG PO CP24: 75.0000 mg | ORAL_CAPSULE | ORAL | 2 refills | Status: DC

## 2022-05-15 MED ORDER — QUETIAPINE FUMARATE 300 MG PO TABS: 300.0000 mg | ORAL_TABLET | Freq: Every day | ORAL | 2 refills | Status: DC

## 2022-05-15 MED ORDER — ESCITALOPRAM OXALATE 20 MG PO TABS: 20.0000 mg | ORAL_TABLET | Freq: Every day | ORAL | 2 refills | Status: DC

## 2022-05-15 MED ORDER — HYDROXYZINE PAMOATE 50 MG PO CAPS: 50.0000 mg | ORAL_CAPSULE | Freq: Two times a day (BID) | ORAL | 2 refills | Status: DC | PRN
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
## 2022-07-04 ENCOUNTER — Ambulatory Visit: Payer: Medicare Other | Admitting: Neurology

## 2022-07-26 ENCOUNTER — Encounter: Payer: Self-pay | Admitting: Dermatology

## 2022-07-26 ENCOUNTER — Ambulatory Visit (INDEPENDENT_AMBULATORY_CARE_PROVIDER_SITE_OTHER): Payer: 59 | Admitting: Dermatology

## 2022-07-26 VITALS — BP 106/68

## 2022-07-26 DIAGNOSIS — L639 Alopecia areata, unspecified: Secondary | ICD-10-CM

## 2022-07-26 MED ORDER — PIMECROLIMUS 1 % EX CREA: TOPICAL_CREAM | Freq: Every evening | CUTANEOUS | 1 refills | Status: DC

## 2022-07-26 MED ORDER — TRIAMCINOLONE ACETONIDE 10 MG/ML IJ SUSP
2.0000 mg | Freq: Once | INTRAMUSCULAR | Status: AC
  Administered 2022-07-26: 2 mg

## 2022-07-26 MED ORDER — CLOBETASOL PROPIONATE 0.05 % EX CREA: 1.0000 | TOPICAL_CREAM | Freq: Every evening | CUTANEOUS | 1 refills | Status: DC

## 2022-07-26 NOTE — Progress Notes (Signed)
   Follow-Up Visit   Subjective  Ashely Tucker is a 52 y.o. male who presents for the following: follow up on Alopecia Areata on the face. He is applying Minoxidil 5% foam every day and applying Clobetasol cream at night. He says he can feel some hair growth.  The following portions of the chart were reviewed this encounter and updated as appropriate:      Review of Systems: No other skin or systemic complaints.  Objective  Well appearing patient in no apparent distress; mood and affect are within normal limits.  A focused examination was performed including the face. Relevant physical exam findings are noted in the Assessment and Plan.  Bilateral Cheeks  exam: Vellus hair growth.    Assessment & Plan   AA (alopecia areata) Bilateral cheeks  Intralesional injection - Bilateral cheeks Procedure Note Intralesional Injection  Location: bilateral cheeks  Informed Consent: Discussed risks (infection, pain, bleeding, bruising, thinning of the skin, loss of skin pigment, lack of resolution, and recurrence of lesion) and benefits of the procedure, as well as the alternatives. Informed consent was obtained. Preparation: The area was prepared a standard fashion.  Anesthesia:    Procedure Details: An intralesional injection was performed with Kenalog 2 mg/cc. 0.2 cc in total were injected. NDC #: 1610-9604-54 Exp: 12/2023  Total number of injections: 10  Plan: The patient was instructed on post-op care. Recommend OTC analgesia as needed for pain.    Alopecia areata   Alopecia areata is a chronic autoimmune condition localized to the skin which affects hair follicles and causes hair loss, most commonly in the scalp.  Cause is unknown.  Can be unpredictable, difficult to treat, and may recur.  Treatments may include topical and intralesional steroids to decrease inflammation to allow for hair regrowth.  Other treatments may include narrowband ultraviolet B light treatment; topical  Squairic acid immunotherapy application; topical or oral Minoxidil; antihistamines and oral Jak inhibitors.    Expected time to see results with current treatment is approximately 6 months. Discussed treatment with Olumiant, oral Minoxidil, and ILK injections. Due to other medications, he declines oral meds.  Recommended:   1) Apply clobetasol cream every night to affected areas at the beard area. For 2 weeks then stop. Alternate with Pimecrolimus. 2 weeks on and 2 weeks off. 2) Apply minoxidil 5% foam qam 3) Add Pimecrolimus every night for 2 weeks alternating with Clobetasol cream   Left Buccal Cheek Procedure Note Intralesional Injection  Location: bilateral cheeks  Informed Consent: Discussed risks (infection, pain, bleeding, bruising, thinning of the skin, loss of skin pigment, lack of resolution, and recurrence of lesion) and benefits of the procedure, as well as the alternatives. Informed consent was obtained. Preparation: The area was prepared a standard fashion.  Anesthesia:    Procedure Details: An intralesional injection was performed with Kenalog 2 mg/cc. 0.2 cc in total were injected. NDC #: 0981-1914-78 Exp: 12/2023  Total number of injections: 10  Plan: The patient was instructed on post-op care. Recommend OTC analgesia as needed for pain.    Return in about 2 months (around 09/25/2022) for Alopecia Follow Up.  Jaclynn Guarneri, CMA, am acting as scribe for [employer], DO.

## 2022-07-26 NOTE — Patient Instructions (Addendum)
Thank you for visiting [place] again and for your dedication to improving your health. It was good to see the progress in your treatment for hair loss. Here is a summary of the key instructions from today's consultation:  - Continue using Minoxidil as previously directed.  - Clobetasol cream: Continue application. We will also start alternating with Pimecrolimus cream every two weeks to manage inflammation without continuous steroid use.  - Kenalog injections: We will administer injections of Kenalog (2 mg per ml) at a total of 0.3 ml to help stimulate hair growth in the affected areas.  - Medication refills: Your prescriptions for the topical treatments will be refilled.  - Follow-up appointment: Please return in two months to assess the effectiveness of the injections and adjust treatment as necessary.  Please continue to manage stress as it can impact your condition. Looking forward to seeing you in two months and hoping for continued improvement.    Due to recent changes in healthcare laws, you may see results of your pathology and/or laboratory studies on MyChart before the doctors have had a chance to review them. We understand that in some cases there may be results that are confusing or concerning to you. Please understand that not all results are received at the same time and often the doctors may need to interpret multiple results in order to provide you with the best plan of care or course of treatment. Therefore, we ask that you please give [place] 2 business days to thoroughly review all your results before contacting the office for clarification. Should we see a critical lab result, you will be contacted sooner.   If You Need Anything After Your Visit  If you have any questions or concerns for your doctor, please call our main line at [phone] If no one answers, please leave a voicemail as directed and we will return your call as soon as possible. Messages left after 4 pm will be  answered the following business day.   You may also send [place] a message via MyChart. We typically respond to MyChart messages within 1-2 business days.  For prescription refills, please ask your pharmacy to contact our office. Our fax number is [phone].  If you have an urgent issue when the clinic is closed that cannot wait until the next business day, you can page your doctor at the number below.    Please note that while we do our best to be available for urgent issues outside of office hours, we are not available 24/7.   If you have an urgent issue and are unable to reach [place], you may choose to seek medical care at your doctor's office, retail clinic, urgent care center, or emergency room.  If you have a medical emergency, please immediately call 911 or go to the emergency department. In the event of inclement weather, please call our main line at [phone] for an update on the status of any delays or closures.  Dermatology Medication Tips: Please keep the boxes that topical medications come in in order to help keep track of the instructions about where and how to use these. Pharmacies typically print the medication instructions only on the boxes and not directly on the medication tubes.   If your medication is too expensive, please contact our office at [phone] or send [place] a message through MyChart.   We are unable to tell what your co-pay for medications will be in advance as this is different depending on your insurance coverage. However, we  may be able to find a substitute medication at lower cost or fill out paperwork to get insurance to cover a needed medication.   If a prior authorization is required to get your medication covered by your insurance company, please allow [place] 1-2 business days to complete this process.  Drug prices often vary depending on where the prescription is filled and some pharmacies may offer cheaper prices.  The website www.goodrx.com contains  coupons for medications through different pharmacies. The prices here do not account for what the cost may be with help from insurance (it may be cheaper with your insurance), but the website can give you the price if you did not use any insurance.  - You can print the associated coupon and take it with your prescription to the pharmacy.  - You may also stop by our office during regular business hours and pick up a GoodRx coupon card.  - If you need your prescription sent electronically to a different pharmacy, notify our office through [facility] or by phone at [phone]

## 2022-07-27 DIAGNOSIS — E669 Obesity, unspecified: Secondary | ICD-10-CM | POA: Insufficient documentation

## 2022-07-27 DIAGNOSIS — R4182 Altered mental status, unspecified: Secondary | ICD-10-CM | POA: Insufficient documentation

## 2022-08-14 ENCOUNTER — Encounter (HOSPITAL_COMMUNITY): Payer: Self-pay | Admitting: Psychiatry

## 2022-08-14 ENCOUNTER — Other Ambulatory Visit (HOSPITAL_COMMUNITY): Payer: Self-pay | Admitting: Psychiatry

## 2022-08-14 ENCOUNTER — Telehealth (HOSPITAL_BASED_OUTPATIENT_CLINIC_OR_DEPARTMENT_OTHER): Payer: 59 | Admitting: Psychiatry

## 2022-08-14 VITALS — Wt 178.0 lb

## 2022-08-14 DIAGNOSIS — F410 Panic disorder [episodic paroxysmal anxiety] without agoraphobia: Secondary | ICD-10-CM | POA: Diagnosis not present

## 2022-08-14 DIAGNOSIS — F411 Generalized anxiety disorder: Secondary | ICD-10-CM

## 2022-08-14 DIAGNOSIS — F319 Bipolar disorder, unspecified: Secondary | ICD-10-CM

## 2022-08-14 MED ORDER — ESCITALOPRAM OXALATE 20 MG PO TABS: 20.0000 mg | ORAL_TABLET | Freq: Every day | ORAL | 2 refills | Status: DC

## 2022-08-14 MED ORDER — QUETIAPINE FUMARATE 300 MG PO TABS: 300.0000 mg | ORAL_TABLET | Freq: Every day | ORAL | 2 refills | Status: DC

## 2022-08-14 MED ORDER — HYDROXYZINE PAMOATE 50 MG PO CAPS: 50.0000 mg | ORAL_CAPSULE | Freq: Two times a day (BID) | ORAL | 2 refills | Status: DC | PRN

## 2022-08-14 MED ORDER — VENLAFAXINE HCL ER 75 MG PO CP24: 75.0000 mg | ORAL_CAPSULE | ORAL | 2 refills | Status: DC
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
## 2022-09-21 ENCOUNTER — Ambulatory Visit (INDEPENDENT_AMBULATORY_CARE_PROVIDER_SITE_OTHER): Payer: 59 | Admitting: Dermatology

## 2022-09-21 DIAGNOSIS — L639 Alopecia areata, unspecified: Secondary | ICD-10-CM | POA: Diagnosis not present

## 2022-09-21 MED ORDER — TRIAMCINOLONE ACETONIDE 10 MG/ML IJ SUSP
10.0000 mg | Freq: Once | INTRAMUSCULAR | Status: AC
  Administered 2022-09-21: 10 mg

## 2022-09-21 NOTE — Progress Notes (Signed)
   Follow-Up Visit   Subjective  Manuel Tucker is a 52 y.o. male who presents for the following: Alopecia  Patient present today for follow up visit for Alopecia. Patient was last evaluated on 07/26/22. Patient reports sxs are improving. Patient reports no medication changes.At his previous visit 1st round of ILK injections. He is still alternating clobetasol cream and Pimecrolimus ointment every 2 weeks. He is also applying Minoxidil 5% foam in the mornings.  The following portions of the chart were reviewed this encounter and updated as appropriate: medications, allergies, medical history  Review of Systems:  No other skin or systemic complaints except as noted in HPI or Assessment and Plan.  Objective  Well appearing patient in no apparent distress; mood and affect are within normal limits.  A focused examination was performed of the following areas: B/L Cheeks  Relevant exam findings are noted in the Assessment and Plan.  Left Buccal Cheek, Right Buccal Cheek ALOPECIA AREATA        Assessment & Plan   Alopecia areata (2) Left Buccal Cheek; Right Buccal Cheek  Procedure Note Intralesional Injection  Location: B/L Cheeks  Informed Consent: Discussed risks (infection, pain, bleeding, bruising, thinning of the skin, loss of skin pigment, lack of resolution, and recurrence of lesion) and benefits of the procedure, as well as the alternatives. Informed consent was obtained. Preparation: The area was prepared a standard fashion.  Anesthesia: None  Procedure Details: An intralesional injection was performed with Kenalog 10 mg/cc.  1 cc in total were injected. NDC #: 1610-9604-54 Exp: 03/2024  Total number of injections: 15  Plan: The patient was instructed on post-op care. Recommend OTC analgesia as needed for pain.   Related Medications clobetasol cream (TEMOVATE) 0.05 % Apply 1 Application topically daily. Apply to the beard area once a day  triamcinolone acetonide  (KENALOG) 10 MG/ML injection 10 mg     Return in about 6 years (around 09/20/2028) for AA F/U.   Documentation: I have reviewed the above documentation for accuracy and completeness, and I agree with the above.  Stasia Cavalier, am acting as scribe for Langston Reusing, DO.  Langston Reusing, DO

## 2022-09-21 NOTE — Patient Instructions (Signed)

## 2022-10-04 ENCOUNTER — Telehealth (HOSPITAL_COMMUNITY): Payer: Self-pay

## 2022-10-04 NOTE — Telephone Encounter (Signed)
I have recommended him to take the melatonin.  He is already taking moderate dose of Seroquel, hydroxyzine, Effexor along with seizure medicine which are Depakote and pain medicine.  I will not add more medication.  Other choices is that he go for sleep study.

## 2022-10-04 NOTE — Telephone Encounter (Signed)
Patient is calling to report that the Seroquel is no linger working to help him sleep. Patient says he is not getting to sleep until 2 or 3 am and waking up at 6 am. He would like to know what to do. Please review and advise, thank you

## 2022-10-09 ENCOUNTER — Emergency Department (HOSPITAL_BASED_OUTPATIENT_CLINIC_OR_DEPARTMENT_OTHER): Payer: 59 | Admitting: Radiology

## 2022-10-09 ENCOUNTER — Other Ambulatory Visit: Payer: Self-pay

## 2022-10-09 ENCOUNTER — Encounter (HOSPITAL_BASED_OUTPATIENT_CLINIC_OR_DEPARTMENT_OTHER): Payer: Self-pay | Admitting: Emergency Medicine

## 2022-10-09 ENCOUNTER — Emergency Department (HOSPITAL_BASED_OUTPATIENT_CLINIC_OR_DEPARTMENT_OTHER)
Admission: EM | Admit: 2022-10-09 | Discharge: 2022-10-09 | Disposition: A | Discharge status: Home / Self Care | Payer: 59 | Attending: Emergency Medicine | Admitting: Emergency Medicine

## 2022-10-09 DIAGNOSIS — M545 Low back pain, unspecified: Secondary | ICD-10-CM | POA: Diagnosis not present

## 2022-10-09 DIAGNOSIS — X500XXA Overexertion from strenuous movement or load, initial encounter: Secondary | ICD-10-CM | POA: Insufficient documentation

## 2022-10-09 DIAGNOSIS — M25571 Pain in right ankle and joints of right foot: Secondary | ICD-10-CM | POA: Insufficient documentation

## 2022-10-09 DIAGNOSIS — Z79899 Other long term (current) drug therapy: Secondary | ICD-10-CM | POA: Diagnosis not present

## 2022-10-09 DIAGNOSIS — Z7984 Long term (current) use of oral hypoglycemic drugs: Secondary | ICD-10-CM | POA: Insufficient documentation

## 2022-10-09 MED ORDER — OXYCODONE-ACETAMINOPHEN 5-325 MG PO TABS
2.0000 | ORAL_TABLET | Freq: Once | ORAL | Status: AC
  Administered 2022-10-09: 2 via ORAL
  Filled 2022-10-09: qty 2

## 2022-10-09 NOTE — Discharge Instructions (Signed)
As discussed, x-ray was negative for any fracture or dislocation.  Recommend continued use of your pain medication as prescribed in the outpatient setting and follow-up with orthopedics for reevaluation.  I have attached number to discharge papers with the orthopedic who is on-call today for [place].  Please do not hesitate to return to emergency department if the worrisome signs and symptoms we discussed become apparent.

## 2022-10-09 NOTE — ED Notes (Signed)
Discharge instructions, follow up care, and pain management reviewed and explained. Pt verbalized understanding and had no further questions on d/c. Lace up brace applied as noted, crutches provided without pt requiring teaching. Pt caox4, ambulatory, NAD on d/c.

## 2022-10-09 NOTE — ED Provider Notes (Signed)
[place] EMERGENCY DEPARTMENT AT [facility] Provider Note   CSN: [id number] Arrival date & time: 10/09/22  1205     History  Chief Complaint  Patient presents with   Leg Pain   Ankle Pain    Manuel Tucker is a 52 y.o. male.   Leg Pain Ankle Pain   52 year old male presents emergency department with complaints of right ankle and low back pain.  Patient states that he was at the counter earlier today when his dog ran beneath his right foot and caused his right ankle to twist inward causing pain.  Denies subsequently falling but states that he may have "tweaked my back" during the episode.  Reports history of chronic low back pain as well as chronic right ankle pain with 9 surgeries on his right ankle and at least 1 surgery on his low back.  States he is on oxycodone as well as gabapentin due to chronic pain.  States he wants to make sure that none of the hardware is messed up."  Denies trauma elsewhere.  Denies any chest pain, shortness of breath, abdominal pain, nausea, vomiting, urinary symptoms, change in bowel habits.  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 Authorizing Provider  acetaminophen (TYLENOL) 500 MG tablet Take 1 tablet (500 mg total) by mouth every 6 (six) hours as needed. Patient taking differently: Take 500 mg by mouth every 6 (six) hours as needed for mild pain. 04/25/19   Fawze, Mina A, PA-C  amLODipine (NORVASC) 10 MG tablet Take 10 mg by mouth daily.  10/02/18   [provider]  atorvastatin (LIPITOR) 20 MG tablet Take 20 mg by mouth daily. 01/10/21   [provider]  carvedilol (COREG) 3.125 MG tablet Take 3.125 mg by mouth 2 (two) times daily with a meal. 01/29/20   [provider]  clobetasol cream (TEMOVATE) 0.05 % Apply 1 Application topically  daily. Apply to the beard area once a day 04/25/22   Terri Piedra, DO  clobetasol cream (TEMOVATE) 0.05 % Apply 1 Application topically at bedtime. Apply to affected areas nightly for 2 weeks then stop. Alternate with Pimecrolimus cream 07/26/22   Terri Piedra, DO  D3-50 1.25 MG (50000 UT) capsule Take 50,000 Units by mouth every Monday. 09/13/21   [provider]  divalproex (DEPAKOTE) 500 MG DR tablet Take 2 tablets twice a day 02/21/22   Van Clines, MD  escitalopram (LEXAPRO) 20 MG tablet Take 1 tablet (20 mg total) by mouth daily. 08/14/22 11/12/22  Arfeen, Phillips Grout, MD  ferrous sulfate 324 MG TBEC Take 324 mg by mouth 3 (three) times a week.    [provider]  hydrOXYzine (VISTARIL) 50 MG capsule Take 1 capsule (50 mg total) by mouth 2 (two) times daily as needed for anxiety. 08/14/22   Arfeen, Phillips Grout, MD  ipratropium (ATROVENT) 0.06 % nasal spray Place 1 spray into both nostrils daily as needed for congestion. 10/18/21   [provider]  lidocaine (LIDODERM) 5 % Place 1 patch onto the skin daily. Remove & Discard patch within 12 hours or as directed by MD 04/08/22   Loetta Rough, MD  lisinopril (ZESTRIL) 20 MG tablet Take 20 mg by mouth daily. 05/07/19   Eartha Inch, MD  metFORMIN (GLUCOPHAGE-XR) 500 MG 24 hr tablet Take 500 mg by mouth every evening. 08/25/21  [provider]  methocarbamol (ROBAXIN) 750 MG tablet Take 1 tablet (750 mg total) by mouth every 6 (six) hours as needed for muscle spasms. 10/19/21   McKenzie, Eilene Ghazi, PA-C  oxyCODONE (OXY IR/ROXICODONE) 5 MG immediate release tablet Take 5 mg by mouth 3 (three) times daily as needed for pain. 09/24/21   [provider]  pimecrolimus (ELIDEL) 1 % cream Apply topically at bedtime. Apply to affected areas nightly for 2 weeks then stop. Alternate use with Clobetasol cream 07/26/22   Terri Piedra, DO  QUEtiapine (SEROQUEL) 300 MG tablet Take 1 tablet (300 mg total) by mouth at  bedtime. 08/14/22 11/12/22  Arfeen, Phillips Grout, MD  venlafaxine XR (EFFEXOR-XR) 75 MG 24 hr capsule Take 1 capsule (75 mg total) by mouth every morning. 08/14/22 11/12/22  Arfeen, Phillips Grout, MD  vitamin B-12 (CYANOCOBALAMIN) 500 MCG tablet Take 500 mcg by mouth daily.    [provider]      Allergies    Banana and Celebrex [celecoxib]    Review of Systems   Review of Systems  All other systems reviewed and are negative.   Physical Exam Updated Vital Signs BP 137/83 (BP Location: Right Arm)   Pulse 77   Temp 98.3 F (36.8 C)   Resp 16   Ht 5\' 8"  (1.727 m)   Wt 80.7 kg   SpO2 100%   BMI 27.05 kg/m  Physical Exam Vitals and nursing note reviewed.  Constitutional:      General: He is not in acute distress.    Appearance: He is well-developed.  HENT:     Head: Normocephalic and atraumatic.  Eyes:     Conjunctiva/sclera: Conjunctivae normal.  Cardiovascular:     Rate and Rhythm: Normal rate and regular rhythm.     Heart sounds: No murmur heard. Pulmonary:     Effort: Pulmonary effort is normal. No respiratory distress.     Breath sounds: Normal breath sounds.  Abdominal:     Palpations: Abdomen is soft.     Tenderness: There is no abdominal tenderness.  Musculoskeletal:        General: No swelling.     Cervical back: Neck supple.     Comments: No midline tenderness of cervical, thoracic, lumbar spine with no obvious top of her deformity noted.  Some paraspinal tenderness noted in the right lumbar region tenderness of right lateral malleolus as well as an area of ATFL.  No obvious swelling.  Limited range of motion of right ankle at baseline due to prior fusion.  Pedal pulses 2+ bilaterally.  No overlying erythema, palpable fluctuance/induration.  Skin:    General: Skin is warm and dry.     Capillary Refill: Capillary refill takes less than 2 seconds.  Neurological:     Mental Status: He is alert.  Psychiatric:        Mood and Affect: Mood normal.     ED Results /  Procedures / Treatments   Labs (all labs ordered are listed, but only abnormal results are displayed) Labs Reviewed - No data to display  EKG None  Radiology DG Ankle Complete Right  Result Date: 10/09/2022 CLINICAL DATA:  Injury. Tripped over dog and fell today. Right ankle pain. History of ankle fusion. EXAM: RIGHT ANKLE - COMPLETE 3+ VIEW COMPARISON:  Right foot and ankle radiographs 04/25/2019 FINDINGS: Extensive postsurgical changes are again seen from ankle and hindfoot fusion. Fusion plates and screws are unchanged in appearance. No acute fracture or dislocation is  identified. Dorsal navicular spurring is unchanged. The soft tissues are unremarkable. IMPRESSION: No acute osseous abnormality identified. Extensive postsurgical changes. Electronically Signed   By: Sebastian Ache M.D.   On: 10/09/2022 14:11    Procedures Procedures    Medications Ordered in ED Medications  oxyCODONE-acetaminophen (PERCOCET/ROXICET) 5-325 MG per tablet 2 tablet (2 tablets Oral Given 10/09/22 1434)    ED Course/ Medical Decision Making/ A&P                                 Medical Decision Making Amount and/or Complexity of Data Reviewed Radiology: ordered.   This patient presents to the ED for concern of right ankle pain, this involves an extensive number of treatment options, and is a complaint that carries with it a high risk of complications and morbidity.  The differential diagnosis includes fracture, strain/sprain, dislocation, ligamentous/tendinous injury, neurovascular compromise   Co morbidities that complicate the patient evaluation  See HPI   Additional history obtained:  Additional history obtained from EMR External records from outside source obtained and reviewed including hospital records   Lab Tests:  N/a   Imaging Studies ordered:  I ordered imaging studies including right ankle x-ray I independently visualized and interpreted imaging which showed no acute osseous  abnormality I agree with the radiologist interpretation   Cardiac Monitoring: / EKG:  The patient was maintained on a cardiac monitor.  I personally viewed and interpreted the cardiac monitored which showed an underlying rhythm of: Sinus rhythm   Consultations Obtained:  N/a   Problem List / ED Course / Critical interventions / Medication management  Right ankle pain I ordered medication including oxycodone   Reevaluation of the patient after these medicines showed that the patient improved I have reviewed the patients home medicines and have made adjustments as needed   Social Determinants of Health:  Denies tobacco, illicit drug use   Test / Admission - Considered:  Right ankle pain Vitals signs within normal range and stable throughout visit. Imaging studies significant for: See above 52 year old male presents emergency department with complaints of right ankle pain as well as right low back pain after inversion injury of his right ankle when his dog caused him to step off balance.  On exam, patient without evidence of neurovascular compromise or secondary infectious process.  Extremity negative for any acute abnormality.  Most likely patient's symptoms likely secondary to ankle sprain.  Will recommend rest, ice, elevation, ankle brace as needed and follow-up with orthopedics in the outpatient setting.  Further workup deemed unnecessary at this time on the ED.  Treatment plan discussed at length with patient and he acknowledged understand was agreeable to said plan.  Patient overall well-appearing, afebrile in no acute distress. Worrisome signs and symptoms were discussed with the patient, and the patient acknowledged understanding to return to the ED if noticed. Patient was stable upon discharge.          Final Clinical Impression(s) / ED Diagnoses Final diagnoses:  Acute right ankle pain    Rx / DC Orders ED Discharge Orders     None         Peter Garter, [place] 10/09/22 1438    Rondel Baton, MD 10/10/22 1045

## 2022-10-09 NOTE — ED Triage Notes (Signed)
Pt via pov from home with right ankle and lower back pain after a fall earlier today. Pt states he tripped over his dog and fell. He has a fused right ankle as well as lower back fusion so is concerned that something is coming loose. Pt alert & oriented, NAD noted.

## 2022-11-02 ENCOUNTER — Ambulatory Visit (INDEPENDENT_AMBULATORY_CARE_PROVIDER_SITE_OTHER): Payer: 59 | Admitting: Dermatology

## 2022-11-02 ENCOUNTER — Encounter: Payer: Self-pay | Admitting: Dermatology

## 2022-11-02 VITALS — BP 106/70 | HR 90

## 2022-11-02 DIAGNOSIS — L639 Alopecia areata, unspecified: Secondary | ICD-10-CM | POA: Diagnosis not present

## 2022-11-02 DIAGNOSIS — E785 Hyperlipidemia, unspecified: Secondary | ICD-10-CM | POA: Diagnosis not present

## 2022-11-02 DIAGNOSIS — Z1159 Encounter for screening for other viral diseases: Secondary | ICD-10-CM

## 2022-11-02 DIAGNOSIS — Z79899 Other long term (current) drug therapy: Secondary | ICD-10-CM

## 2022-11-02 DIAGNOSIS — Z111 Encounter for screening for respiratory tuberculosis: Secondary | ICD-10-CM

## 2022-11-02 MED ORDER — OLUMIANT 4 MG PO TABS
1.0000 | ORAL_TABLET | Freq: Every day | ORAL | 4 refills | Status: DC
Start: 2022-11-02 — End: 2023-06-07

## 2022-11-02 NOTE — Progress Notes (Signed)
   Follow-Up Visit   Subjective  Manuel Tucker is a 52 y.o. male who presents for the following: alopecia  Patient present today for follow up visit for Alopecia. Patient was last evaluated on 09/21/22. Patient reports sxs are improving. Patient reports no medication changes.At his previous he received injections #2.  He is still alternating clobetasol cream and Pimecrolimus ointment every 2 weeks and applying Minoxidil 5% every morning.   The following portions of the chart were reviewed this encounter and updated as appropriate: medications, allergies, medical history  Review of Systems:  No other skin or systemic complaints except as noted in HPI or Assessment and Plan.  Objective  Well appearing patient in no apparent distress; mood and affect are within normal limits.  A focused examination was performed of the following areas: face  Relevant exam findings are noted in the Assessment and Plan.  Exam: Vellus hair growth.          Assessment & Plan   Alopecia Areata  Chronic and persistent condition with duration or expected duration over one year. Condition is symptomatic/ bothersome to patient. Not currently at goal.   Treatment Plan: Get labs now and will prescribe olumiant  Long term medication management.  Patient is using long term (months to years) prescription medication  to control their dermatologic condition.  These medications require periodic monitoring to evaluate for efficacy and side effects and may require periodic laboratory monitoring.    - Assessment: Patient has been receiving injections since March with minimal hair regrowth observed. - Plan:   - Discontinue injections due to risk of skin atrophy.   - Consider starting Olumiant (Baricitinib) 4 mg daily for hair regrowth.     - Obtain necessary lab work before initiating treatment:       - Hepatitis panel       - Quantiferon gold (TB screening)       - Cholesterol levels (lipid panel)-> checked  recent labs on pt's phone, all WNL.  Pt will have print out at next visit.     - Prescribe Olumiant through [facility] specialty pharmacy once lab results are reviewed.     - Provide patient with samples of Olumiant after lab results are cleared.     - Discontinue topical treatments once the patient starts Olumiant.     - Schedule a follow-up appointment in three months to assess progress and reevaluate treatment plan.  2. Hyperlipidemia - Assessment: Patient has a history of high cholesterol, but recent levels have been well-controlled. - Plan: Monitor cholesterol levels as part of the lab work required for initiating Olumiant treatment.  3. Patient Education - Plan:   - Discuss the potential risks and benefits of Olumiant, including possible side effects and the need for regular lab monitoring.   - Inform the patient about the timeline for potential hair regrowth and the importance of giving the treatment a full six months before deciding on its effectiveness.  No follow-ups on file.  Owens Shark, CMA, am acting as scribe for [employer], DO.   Documentation: I have reviewed the above documentation for accuracy and completeness, and I agree with the above.  Langston Reusing, DO

## 2022-11-02 NOTE — Patient Instructions (Addendum)
Hello Manuel Tucker,  Thank you for visiting [place] today. We appreciate your commitment to addressing your health concerns. Here is a summary of the key instructions from today's consultation regarding your treatment plan for alopecia areata:  - Medication: Begin taking Olumiant (4 mg tablet daily).  - Laboratory Tests: Complete the following tests before starting Olumiant:   - Hepatitis panel   - Quantiferon gold test for TB     - Follow-Up: After receiving your lab results, pick up Olumiant samples from my office. I will message you on MyChart when they are ready. No appointment is needed for sample pickup.  - Next Appointment: Schedule a follow-up in three months to assess the effectiveness of the medication.  - Continued Care: Continue using topical treatments until you start Olumiant. You may discontinue topicals once Olumiant treatment begins.  Please ensure to get the lab work done as soon as possible so we can proceed with your treatment promptly. If you have any questions or concerns, feel free to reach out.  Best regards,  Dr. Langston Reusing, Dermatology  Important Information   Due to recent changes in healthcare laws, you may see results of your pathology and/or laboratory studies on MyChart before the doctors have had a chance to review them. We understand that in some cases there may be results that are confusing or concerning to you. Please understand that not all results are received at the same time and often the doctors may need to interpret multiple results in order to provide you with the best plan of care or course of treatment. Therefore, we ask that you please give [place] 2 business days to thoroughly review all your results before contacting the office for clarification. Should we see a critical lab result, you will be contacted sooner.     If You Need Anything After Your Visit   If you have any questions or concerns for your doctor, please call our main line at [phone].  If no one answers, please leave a voicemail as directed and we will return your call as soon as possible. Messages left after 4 pm will be answered the following business day.    You may also send [place] a message via MyChart. We typically respond to MyChart messages within 1-2 business days.  For prescription refills, please ask your pharmacy to contact our office. Our fax number is [phone].  If you have an urgent issue when the clinic is closed that cannot wait until the next business day, you can page your doctor at the number below.     Please note that while we do our best to be available for urgent issues outside of office hours, we are not available 24/7.    If you have an urgent issue and are unable to reach [place], you may choose to seek medical care at your doctor's office, retail clinic, urgent care center, or emergency room.   If you have a medical emergency, please immediately call 911 or go to the emergency department. In the event of inclement weather, please call our main line at [phone] for an update on the status of any delays or closures.  Dermatology Medication Tips: Please keep the boxes that topical medications come in in order to help keep track of the instructions about where and how to use these. Pharmacies typically print the medication instructions only on the boxes and not directly on the medication tubes.   If your medication is too expensive, please contact our office at [phone]  or send [place] a message through MyChart.    We are unable to tell what your co-pay for medications will be in advance as this is different depending on your insurance coverage. However, we may be able to find a substitute medication at lower cost or fill out paperwork to get insurance to cover a needed medication.    If a prior authorization is required to get your medication covered by your insurance company, please allow [place] 1-2 business days to complete this process.   Drug prices  often vary depending on where the prescription is filled and some pharmacies may offer cheaper prices.   The website www.goodrx.com contains coupons for medications through different pharmacies. The prices here do not account for what the cost may be with help from insurance (it may be cheaper with your insurance), but the website can give you the price if you did not use any insurance.  - You can print the associated coupon and take it with your prescription to the pharmacy.  - You may also stop by our office during regular business hours and pick up a GoodRx coupon card.  - If you need your prescription sent electronically to a different pharmacy, notify our office through [facility] or by phone at [phone]

## 2022-11-06 ENCOUNTER — Encounter: Payer: Self-pay | Admitting: Dermatology

## 2022-11-06 LAB — ACUTE HEP PANEL AND HEP B SURFACE AB
Hep A IgM: NEGATIVE
Hep B C IgM: NEGATIVE
Hep C Virus Ab: NONREACTIVE
Hepatitis B Surf Ab Quant: <3.5 m[IU]/mL — ABNORMAL LOW
Hepatitis B Surface Ag: NEGATIVE

## 2022-11-06 LAB — QUANTIFERON-TB GOLD PLUS
QuantiFERON Mitogen Value: >10 [IU]/mL
QuantiFERON Nil Value: 0.01 [IU]/mL
QuantiFERON TB1 Ag Value: 0.02 [IU]/mL
QuantiFERON TB2 Ag Value: 0.03 [IU]/mL
QuantiFERON-TB Gold Plus: NEGATIVE

## 2022-11-07 ENCOUNTER — Other Ambulatory Visit (HOSPITAL_COMMUNITY): Payer: Self-pay | Admitting: Psychiatry

## 2022-11-07 DIAGNOSIS — F411 Generalized anxiety disorder: Secondary | ICD-10-CM

## 2022-11-07 DIAGNOSIS — F410 Panic disorder [episodic paroxysmal anxiety] without agoraphobia: Secondary | ICD-10-CM

## 2022-11-07 NOTE — Telephone Encounter (Signed)
I sent pt their results.  He's ok to start Olumiant 4mg  daily.  He'll be stopping by to pick up a 30day supply sample.  Thanks!

## 2022-11-07 NOTE — Telephone Encounter (Signed)
Can we take a look his form and see what section he's referring to?

## 2022-11-07 NOTE — Progress Notes (Signed)
Hi Crosby,  All your labs were normal and it's ok to start taking the Olumiant 4mg  tablets.  You can swing by the office to pick up samples.  We should see you back in 3 months to check progress and recheck you lipid profile.    Best,  Dr. Onalee Hua

## 2022-11-09 ENCOUNTER — Encounter (HOSPITAL_COMMUNITY): Payer: Self-pay | Admitting: Psychiatry

## 2022-11-09 ENCOUNTER — Other Ambulatory Visit (HOSPITAL_COMMUNITY): Payer: Self-pay | Admitting: Psychiatry

## 2022-11-09 ENCOUNTER — Telehealth (HOSPITAL_COMMUNITY): Payer: 59 | Admitting: Psychiatry

## 2022-11-09 VITALS — Wt 177.0 lb

## 2022-11-09 DIAGNOSIS — F411 Generalized anxiety disorder: Secondary | ICD-10-CM | POA: Diagnosis not present

## 2022-11-09 DIAGNOSIS — F319 Bipolar disorder, unspecified: Secondary | ICD-10-CM

## 2022-11-09 DIAGNOSIS — F410 Panic disorder [episodic paroxysmal anxiety] without agoraphobia: Secondary | ICD-10-CM

## 2022-11-09 MED ORDER — ESCITALOPRAM OXALATE 20 MG PO TABS
20.0000 mg | ORAL_TABLET | Freq: Every day | ORAL | 2 refills | Status: DC
Start: 2022-11-09 — End: 2023-02-09

## 2022-11-09 MED ORDER — HYDROXYZINE PAMOATE 50 MG PO CAPS: 50.0000 mg | ORAL_CAPSULE | Freq: Two times a day (BID) | ORAL | 2 refills | Status: DC | PRN

## 2022-11-09 MED ORDER — VENLAFAXINE HCL ER 75 MG PO CP24: 75.0000 mg | ORAL_CAPSULE | ORAL | 2 refills | Status: DC

## 2022-11-09 MED ORDER — QUETIAPINE FUMARATE 300 MG PO TABS: 300.0000 mg | ORAL_TABLET | Freq: Every day | ORAL | 2 refills | Status: DC
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
## 2023-02-06 ENCOUNTER — Encounter: Payer: Self-pay | Admitting: Dermatology

## 2023-02-06 ENCOUNTER — Ambulatory Visit (INDEPENDENT_AMBULATORY_CARE_PROVIDER_SITE_OTHER): Payer: Medicare Other | Admitting: Dermatology

## 2023-02-06 DIAGNOSIS — L639 Alopecia areata, unspecified: Secondary | ICD-10-CM | POA: Diagnosis not present

## 2023-02-06 DIAGNOSIS — Z79899 Other long term (current) drug therapy: Secondary | ICD-10-CM | POA: Diagnosis not present

## 2023-02-06 MED ORDER — PIMECROLIMUS 1 % EX CREA: TOPICAL_CREAM | Freq: Every evening | CUTANEOUS | 1 refills | Status: AC

## 2023-02-06 MED ORDER — CLOBETASOL PROPIONATE 0.05 % EX CREA: 1.0000 | TOPICAL_CREAM | Freq: Every evening | CUTANEOUS | 1 refills | Status: AC

## 2023-02-06 NOTE — Patient Instructions (Addendum)
 Hello Manuel Tucker,  Thank you for visiting my office today. Your dedication to improving your health and managing your condition is greatly appreciated. Here is a summary of the key instructions and recommendations from today's consultation:  - Dermaroller: Continue using the Dermaroller, ensuring to replace it at least every two weeks to prevent dullness and bacterial buildup.  - Clobetasol  Cream: Maintain your current regimen, applying the cream Monday through Friday.  - Olumiant : Continue taking Olumiant  4 mg daily for alopecia areata. We have noted some positive hair regrowth.  - Upcoming Laboratory Tests:   - Complete Blood Count (CBC)   - Comprehensive Metabolic Panel (CMP)   - Lipid Panel These tests are crucial for monitoring your lipids and liver functions, ensuring the continued safety of your treatment.  - Follow-Up: We have scheduled a follow-up appointment in four months to assess progress. Should any issues arise from your lab results, I will contact you immediately.  Please continue with your current treatment plan and do not hesitate to reach out if you have any questions or concerns.  Best regards,  Dr. Delon Lenis Dermatology  Important Information  Due to recent changes in healthcare laws, you may see results of your pathology and/or laboratory studies on MyChart before the doctors have had a chance to review them. We understand that in some cases there may be results that are confusing or concerning to you. Please understand that not all results are received at the same time and often the doctors may need to interpret multiple results in order to provide you with the best plan of care or course of treatment. Therefore, we ask that you please give us  2 business days to thoroughly review all your results before contacting the office for clarification. Should we see a critical lab result, you will be contacted sooner.   If You Need Anything After Your Visit  If you  have any questions or concerns for your doctor, please call our main line at [phone] If no one answers, please leave a voicemail as directed and we will return your call as soon as possible. Messages left after 4 pm will be answered the following business day.   You may also send us  a message via MyChart. We typically respond to MyChart messages within 1-2 business days.  For prescription refills, please ask your pharmacy to contact our office. Our fax number is [phone].  If you have an urgent issue when the clinic is closed that cannot wait until the next business day, you can page your doctor at the number below.    Please note that while we do our best to be available for urgent issues outside of office hours, we are not available 24/7.   If you have an urgent issue and are unable to reach us , you may choose to seek medical care at your doctor's office, retail clinic, urgent care center, or emergency room.  If you have a medical emergency, please immediately call 911 or go to the emergency department. In the event of inclement weather, please call our main line at [phone] for an update on the status of any delays or closures.  Dermatology Medication Tips: Please keep the boxes that topical medications come in in order to help keep track of the instructions about where and how to use these. Pharmacies typically print the medication instructions only on the boxes and not directly on the medication tubes.   If your medication is too expensive, please contact our office at [phone]  or send us  a message through MyChart.   We are unable to tell what your co-pay for medications will be in advance as this is different depending on your insurance coverage. However, we may be able to find a substitute medication at lower cost or fill out paperwork to get insurance to cover a needed medication.   If a prior authorization is required to get your medication covered by your insurance  company, please allow us  1-2 business days to complete this process.  Drug prices often vary depending on where the prescription is filled and some pharmacies may offer cheaper prices.  The website www.goodrx.com contains coupons for medications through different pharmacies. The prices here do not account for what the cost may be with help from insurance (it may be cheaper with your insurance), but the website can give you the price if you did not use any insurance.  - You can print the associated coupon and take it with your prescription to the pharmacy.  - You may also stop by our office during regular business hours and pick up a GoodRx coupon card.  - If you need your prescription sent electronically to a different pharmacy, notify our office through [facility] or by phone at [phone]

## 2023-02-06 NOTE — Progress Notes (Signed)
   Follow-Up Visit   Subjective  Manuel Tucker is a 53 y.o. male who presents for the following: alopecia areata  Patient present today for follow up visit for alopecia areata. Patient was last evaluated on 11/02/22. He stated that he has been alternating the clobetasol  and pimecrolimus  ointment every 2 week, he is also still applying Minoxidil QAM and Olumiant . Patient reports sxs are better. Patient denies medication changes.  The following portions of the chart were reviewed this encounter and updated as appropriate: medications, allergies, medical history  Review of Systems:  No other skin or systemic complaints except as noted in HPI or Assessment and Plan.  Objective  Well appearing patient in no apparent distress; mood and affect are within normal limits.   A focused examination was performed of the following areas: face   Relevant exam findings are noted in the Assessment and Plan.           Assessment & Plan   1. Alopecia Areata - Assessment: Patient has been on Olumiant  4 mg daily for 3 months, showing positive hair regrowth on cheeks, sideburns, and neck without side effects. Previous labs were conducted before medication initiation.  - Plan:  Continue Olumiant  4 mg daily. Obtain CBC, CMP, and lipid panel to assess safety for continued use. Follow-up in 4 months. Clinician to review lab results and contact patient if any concerns arise.   Continue current treatment regimen with Clobetasol  cream. Educate on proper Dermaroller use, including replacing the Dermaroller at least weekly, no longer than every 2 weeks, and caution about potential reactions when combining Dermaroller with certain ingredients. Maintain weekend breaks from treatment for skin recovery.   ALOPECIA AREATA   Related Procedures CBC CMP Lipid Panel Related Medications clobetasol  cream (TEMOVATE ) 0.05 % Apply 1 Application topically daily. Apply to the beard area once a day Baricitinib   (OLUMIANT ) 4 MG TABS Take 1 tablet by mouth daily.  Return in about 4 months (around 06/06/2023) for alopecia areata.    Documentation: I have reviewed the above documentation for accuracy and completeness, and I agree with the above.   I, Shirron Maranda, CMA, am acting as scribe for [employer], DO.   Delon Lenis, DO

## 2023-02-07 ENCOUNTER — Encounter: Payer: Self-pay | Admitting: Dermatology

## 2023-02-07 LAB — LIPID PANEL
Chol/HDL Ratio: 2.2 {ratio} (ref 0.0–5.0)
Cholesterol, Total: 164 mg/dL (ref 100–199)
HDL: 75 mg/dL (ref >39)
LDL Chol Calc (NIH): 75 mg/dL (ref 0–99)
Triglycerides: 72 mg/dL (ref 0–149)
VLDL Cholesterol Cal: 14 mg/dL (ref 5–40)

## 2023-02-07 LAB — COMPREHENSIVE METABOLIC PANEL
ALT: 17 [IU]/L (ref 0–44)
AST: 14 [IU]/L (ref 0–40)
Albumin: 4.3 g/dL (ref 3.8–4.9)
Alkaline Phosphatase: 73 [IU]/L (ref 44–121)
BUN/Creatinine Ratio: 9 (ref 9–20)
BUN: 13 mg/dL (ref 6–24)
Bilirubin Total: <0.2 mg/dL (ref 0.0–1.2)
CO2: 19 mmol/L — ABNORMAL LOW (ref 20–29)
Calcium: 9.4 mg/dL (ref 8.7–10.2)
Chloride: 107 mmol/L — ABNORMAL HIGH (ref 96–106)
Creatinine, Ser: 1.48 mg/dL — ABNORMAL HIGH (ref 0.76–1.27)
Globulin, Total: 2.5 g/dL (ref 1.5–4.5)
Glucose: 125 mg/dL — ABNORMAL HIGH (ref 70–99)
Potassium: 4.2 mmol/L (ref 3.5–5.2)
Sodium: 143 mmol/L (ref 134–144)
Total Protein: 6.8 g/dL (ref 6.0–8.5)
eGFR: 56 mL/min/{1.73_m2} — ABNORMAL LOW (ref >59)

## 2023-02-07 LAB — CBC
Hematocrit: 26.9 % — ABNORMAL LOW (ref 37.5–51.0)
Hemoglobin: 8.8 g/dL — ABNORMAL LOW (ref 13.0–17.7)
MCH: 28.2 pg (ref 26.6–33.0)
MCHC: 32.7 g/dL (ref 31.5–35.7)
MCV: 86 fL (ref 79–97)
Platelets: 367 10*3/uL (ref 150–450)
RBC: 3.12 x10E6/uL — ABNORMAL LOW (ref 4.14–5.80)
RDW: 15.8 % — ABNORMAL HIGH (ref 11.6–15.4)
WBC: 6.1 10*3/uL (ref 3.4–10.8)

## 2023-02-09 ENCOUNTER — Telehealth (HOSPITAL_BASED_OUTPATIENT_CLINIC_OR_DEPARTMENT_OTHER): Payer: 59 | Admitting: Psychiatry

## 2023-02-09 ENCOUNTER — Other Ambulatory Visit (HOSPITAL_COMMUNITY): Payer: Self-pay | Admitting: Psychiatry

## 2023-02-09 ENCOUNTER — Encounter (HOSPITAL_COMMUNITY): Payer: Self-pay | Admitting: Psychiatry

## 2023-02-09 VITALS — Wt 175.0 lb

## 2023-02-09 DIAGNOSIS — F410 Panic disorder [episodic paroxysmal anxiety] without agoraphobia: Secondary | ICD-10-CM

## 2023-02-09 DIAGNOSIS — F411 Generalized anxiety disorder: Secondary | ICD-10-CM | POA: Diagnosis not present

## 2023-02-09 DIAGNOSIS — F319 Bipolar disorder, unspecified: Secondary | ICD-10-CM | POA: Diagnosis not present

## 2023-02-09 MED ORDER — VENLAFAXINE HCL ER 75 MG PO CP24
75.0000 mg | ORAL_CAPSULE | ORAL | 2 refills | Status: DC
Start: 2023-02-09 — End: 2023-05-09

## 2023-02-09 MED ORDER — ESCITALOPRAM OXALATE 20 MG PO TABS
20.0000 mg | ORAL_TABLET | Freq: Every day | ORAL | 2 refills | Status: DC
Start: 2023-02-09 — End: 2023-05-09

## 2023-02-09 MED ORDER — QUETIAPINE FUMARATE 300 MG PO TABS
300.0000 mg | ORAL_TABLET | Freq: Every day | ORAL | 2 refills | Status: DC
Start: 2023-02-09 — End: 2023-05-09

## 2023-02-09 MED ORDER — HYDROXYZINE PAMOATE 50 MG PO CAPS: 100.0000 mg | ORAL_CAPSULE | Freq: Every day | ORAL | 2 refills | Status: DC
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
## 2023-02-10 ENCOUNTER — Encounter (HOSPITAL_BASED_OUTPATIENT_CLINIC_OR_DEPARTMENT_OTHER): Payer: Self-pay | Admitting: *Deleted

## 2023-02-10 ENCOUNTER — Emergency Department (HOSPITAL_BASED_OUTPATIENT_CLINIC_OR_DEPARTMENT_OTHER)
Admission: EM | Admit: 2023-02-10 | Discharge: 2023-02-10 | Disposition: A | Discharge status: Home / Self Care | Payer: Medicare Other

## 2023-02-10 ENCOUNTER — Emergency Department (HOSPITAL_BASED_OUTPATIENT_CLINIC_OR_DEPARTMENT_OTHER): Payer: Medicare Other

## 2023-02-10 ENCOUNTER — Other Ambulatory Visit: Payer: Self-pay

## 2023-02-10 DIAGNOSIS — I10 Essential (primary) hypertension: Secondary | ICD-10-CM | POA: Diagnosis not present

## 2023-02-10 DIAGNOSIS — R109 Unspecified abdominal pain: Secondary | ICD-10-CM | POA: Insufficient documentation

## 2023-02-10 DIAGNOSIS — Z7984 Long term (current) use of oral hypoglycemic drugs: Secondary | ICD-10-CM | POA: Insufficient documentation

## 2023-02-10 DIAGNOSIS — Z79899 Other long term (current) drug therapy: Secondary | ICD-10-CM | POA: Insufficient documentation

## 2023-02-10 DIAGNOSIS — E119 Type 2 diabetes mellitus without complications: Secondary | ICD-10-CM | POA: Diagnosis not present

## 2023-02-10 LAB — URINALYSIS, ROUTINE W REFLEX MICROSCOPIC
Bilirubin Urine: NEGATIVE
Glucose, UA: NEGATIVE mg/dL
Hgb urine dipstick: NEGATIVE
Ketones, ur: NEGATIVE mg/dL
Leukocytes,Ua: NEGATIVE
Nitrite: NEGATIVE
Protein, ur: NEGATIVE mg/dL
Specific Gravity, Urine: 1.005 (ref 1.005–1.030)
pH: 6 (ref 5.0–8.0)

## 2023-02-10 LAB — COMPREHENSIVE METABOLIC PANEL
ALT: 11 U/L (ref 0–44)
AST: 12 U/L — ABNORMAL LOW (ref 15–41)
Albumin: 4.1 g/dL (ref 3.5–5.0)
Alkaline Phosphatase: 50 U/L (ref 38–126)
Anion gap: 7 (ref 5–15)
BUN: 15 mg/dL (ref 6–20)
CO2: 25 mmol/L (ref 22–32)
Calcium: 8.7 mg/dL — ABNORMAL LOW (ref 8.9–10.3)
Chloride: 108 mmol/L (ref 98–111)
Creatinine, Ser: 1.64 mg/dL — ABNORMAL HIGH (ref 0.61–1.24)
GFR, Estimated: 50 mL/min — ABNORMAL LOW (ref >60)
Glucose, Bld: 92 mg/dL (ref 70–99)
Potassium: 3.6 mmol/L (ref 3.5–5.1)
Sodium: 140 mmol/L (ref 135–145)
Total Bilirubin: 0.4 mg/dL (ref 0.0–1.2)
Total Protein: 6.7 g/dL (ref 6.5–8.1)

## 2023-02-10 LAB — CBC
HCT: 23.1 % — ABNORMAL LOW (ref 39.0–52.0)
Hemoglobin: 8.3 g/dL — ABNORMAL LOW (ref 13.0–17.0)
MCH: 28.7 pg (ref 26.0–34.0)
MCHC: 35.9 g/dL (ref 30.0–36.0)
MCV: 79.9 fL — ABNORMAL LOW (ref 80.0–100.0)
Platelets: 334 10*3/uL (ref 150–400)
RBC: 2.89 MIL/uL — ABNORMAL LOW (ref 4.22–5.81)
RDW: 15.3 % (ref 11.5–15.5)
WBC: 4.7 10*3/uL (ref 4.0–10.5)
nRBC: 0 % (ref 0.0–0.2)

## 2023-02-10 MED ORDER — KETOROLAC TROMETHAMINE 30 MG/ML IJ SOLN
30.0000 mg | Freq: Once | INTRAMUSCULAR | Status: AC
  Administered 2023-02-10: 30 mg via INTRAVENOUS
  Filled 2023-02-10: qty 1

## 2023-02-10 MED ORDER — LIDOCAINE 5 % EX PTCH
1.0000 | MEDICATED_PATCH | Freq: Once | CUTANEOUS | Status: DC
  Administered 2023-02-10: 1 via TRANSDERMAL
  Filled 2023-02-10: qty 1

## 2023-02-10 MED ORDER — KETOROLAC TROMETHAMINE 30 MG/ML IJ SOLN: 30.0000 mg | Freq: Once | INTRAMUSCULAR | Status: DC

## 2023-02-10 NOTE — ED Provider Notes (Signed)
 [place] EMERGENCY DEPARTMENT AT [facility] Provider Note   CSN: [id number] Arrival date & time: 02/10/23  2105     History  Chief Complaint  Patient presents with   Flank Pain    Manuel Tucker is a 53 y.o. male with history of multiple ankle surgeries, back surgery s/p spinal cord stimulator, HTN, depression, anemia (B12 and iron deficiency),  who presents to the ER complaining of left sided flank pain x 2 days Was at dermatologist for regular visit and they ordered some labs which showed a mildly decreased eGFR. He has increased his fluid intake significantly. Pain is unrelieved by tylenol , oxycodone  (takes as part of pain management), or gabapentin . No rashes, fever, urinary symptoms, or diarrhea. Pain does not radiate. No identified aggravating factors. Pt states he felt fine earlier today and pain started again around 7 pm this evening.    Flank Pain       Home Medications Prior to Admission medications   Medication Sig Start Date End Date Taking? Authorizing Provider  acetaminophen  (TYLENOL ) 500 MG tablet Take 1 tablet (500 mg total) by mouth every 6 (six) hours as needed. Patient taking differently: Take 500 mg by mouth every 6 (six) hours as needed for mild pain (pain score 1-3). 04/25/19   Meryle, Mina A, PA-Tucker  amLODipine (NORVASC) 10 MG tablet Take 10 mg by mouth daily.  10/02/18   [provider]  atorvastatin (LIPITOR) 20 MG tablet Take 20 mg by mouth daily. 01/10/21   [provider]  Baricitinib  (OLUMIANT ) 4 MG TABS Take 1 tablet by mouth daily. 11/02/22   Manuel Delon SAILOR, DO  carvedilol (COREG) 3.125 MG tablet Take 3.125 mg by mouth 2 (two) times daily with a meal. 01/29/20   [provider]  clobetasol  cream (TEMOVATE ) 0.05 % Apply 1 Application topically daily. Apply to the beard area once a day 04/25/22   Manuel Delon SAILOR, DO  clobetasol  cream (TEMOVATE ) 0.05 % Apply 1 Application topically at bedtime. Apply to affected areas  nightly for 2 weeks then stop. Alternate with Pimecrolimus  cream 02/06/23   Manuel Delon SAILOR, DO  D3-50 1.25 MG (50000 UT) capsule Take 50,000 Units by mouth every Monday. 09/13/21   [provider]  divalproex  (DEPAKOTE ) 500 MG DR tablet Take 2 tablets twice a day 02/21/22   Manuel Darice HERO, MD  escitalopram  (LEXAPRO ) 20 MG tablet Take 1 tablet (20 mg total) by mouth daily. 02/09/23 05/10/23  Manuel Tucker, Manuel DASEN, MD  ferrous sulfate 324 MG TBEC Take 324 mg by mouth 3 (three) times a week.    [provider]  hydrOXYzine  (VISTARIL ) 50 MG capsule Take 2 capsules (100 mg total) by mouth at bedtime. 02/09/23   Manuel Tucker, Manuel DASEN, MD  ipratropium (ATROVENT) 0.06 % nasal spray Place 1 spray into both nostrils daily as needed for congestion. 10/18/21   [provider]  lidocaine  (LIDODERM ) 5 % Place 1 patch onto the skin daily. Remove & Discard patch within 12 hours or as directed by MD 04/08/22   Manuel Sid SAILOR, MD  lisinopril (ZESTRIL) 20 MG tablet Take 20 mg by mouth daily. 05/07/19   Manuel Tucker, Manuel C, MD  metFORMIN (GLUCOPHAGE-XR) 500 MG 24 hr tablet Take 500 mg by mouth every evening. 08/25/21   [provider]  methocarbamol  (ROBAXIN ) 750 MG tablet Take 1 tablet (750 mg total) by mouth every 6 (six) hours as needed for muscle spasms. 10/19/21   Manuel Tucker, Manuel J, PA-Tucker  oxyCODONE  (OXY IR/ROXICODONE )  5 MG immediate release tablet Take 5 mg by mouth 3 (three) times daily as needed for pain. 09/24/21   [provider]  pimecrolimus  (ELIDEL ) 1 % cream Apply topically at bedtime. Apply to affected areas nightly for 2 weeks then stop. Alternate use with Clobetasol  cream 02/06/23   Manuel Delon SAILOR, DO  QUEtiapine  (SEROQUEL ) 300 MG tablet Take 1 tablet (300 mg total) by mouth at bedtime. 02/09/23 05/10/23  Manuel Tucker, Manuel DASEN, MD  venlafaxine  XR (EFFEXOR -XR) 75 MG 24 hr capsule Take 1 capsule (75 mg total) by mouth every morning. 02/09/23 05/10/23  Manuel Tucker, Manuel DASEN, MD  vitamin B-12  (CYANOCOBALAMIN) 500 MCG tablet Take 500 mcg by mouth daily.    [provider]      Allergies    Banana and Celebrex [celecoxib]    Review of Systems   Review of Systems  Genitourinary:  Positive for flank pain.  All other systems reviewed and are negative.   Physical Exam Updated Vital Signs BP (!) 145/90 (BP Location: Right Arm)   Pulse 70   Temp 98.6 F (37 Tucker) (Oral)   Resp 16   SpO2 100%  Physical Exam Vitals and nursing note reviewed.  Constitutional:      Appearance: Normal appearance.  HENT:     Head: Normocephalic and atraumatic.  Eyes:     Conjunctiva/sclera: Conjunctivae normal.  Cardiovascular:     Rate and Rhythm: Normal rate and regular rhythm.  Pulmonary:     Effort: Pulmonary effort is normal. No respiratory distress.     Breath sounds: Normal breath sounds.  Abdominal:     General: Abdomen is flat. There is no distension.     Palpations: Abdomen is soft.     Tenderness: There is no abdominal tenderness. There is no right CVA tenderness or left CVA tenderness.  Musculoskeletal:       Arms:     Comments: Reproducible tenderness to palpation  Skin:    General: Skin is warm and dry.     Comments: No rashes on the back  Neurological:     General: No focal deficit present.     Mental Status: He is alert.  Psychiatric:        Mood and Affect: Mood normal.        Behavior: Behavior normal.     ED Results / Procedures / Treatments   Labs (all labs ordered are listed, but only abnormal results are displayed) Labs Reviewed  URINALYSIS, ROUTINE W REFLEX MICROSCOPIC - Abnormal; Notable for the following components:      Result Value   Color, Urine COLORLESS (*)    All other components within normal limits  CBC - Abnormal; Notable for the following components:   RBC 2.89 (*)    Hemoglobin 8.3 (*)    HCT 23.1 (*)    MCV 79.9 (*)    All other components within normal limits  COMPREHENSIVE METABOLIC PANEL - Abnormal; Notable for the  following components:   Creatinine, Ser 1.64 (*)    Calcium 8.7 (*)    AST 12 (*)    GFR, Estimated 50 (*)    All other components within normal limits    EKG None  Radiology CT Renal Stone Study Result Date: 02/10/2023 CLINICAL DATA:  Left flank pain x2 days EXAM: CT ABDOMEN AND PELVIS WITHOUT CONTRAST TECHNIQUE: Multidetector CT imaging of the abdomen and pelvis was performed following the standard protocol without IV contrast. RADIATION DOSE REDUCTION: This exam was performed according  to the departmental dose-optimization program which includes automated exposure control, adjustment of the mA and/or kV according to patient size and/or use of iterative reconstruction technique. COMPARISON:  10/20/2021 FINDINGS: Lower chest: Trace right pleural fluid. Hepatobiliary: Unenhanced liver is unremarkable. Gallbladder is decompressed. No intrahepatic or extrahepatic ductal dilatation. Pancreas: Within normal limits. Spleen: Within normal limits. Adrenals/Urinary Tract: Adrenal glands are within normal limits. Kidneys are within normal limits. No renal, ureteral, or bladder calculi. No hydronephrosis. Bladder is mildly thick-walled although underdistended. Stomach/Bowel: Stomach is within normal limits. No evidence of bowel obstruction. Normal appendix (series 2/image 81). No colonic wall thickening or inflammatory changes Vascular/Lymphatic: No evidence of abdominal aortic aneurysm. Atherosclerotic calcifications of the abdominal aorta and branch vessels. No suspicious abdominopelvic lymphadenopathy. Reproductive: Prostate is unremarkable. Other: No abdominopelvic ascites. Musculoskeletal: Status post PLIF at L5-S1. Mild degenerative changes of the visualized thoracolumbar spine. Thoracic spine stimulator. IMPRESSION: No CT findings to account for the patient's left flank pain. Additional ancillary findings as above. Electronically Signed   By: Manuel Pebbles M.D.   On: 02/10/2023 22:16     Procedures Procedures    Medications Ordered in ED Medications  lidocaine  (LIDODERM ) 5 % 1 patch (1 patch Transdermal Patch Applied 02/10/23 2254)  ketorolac  (TORADOL ) 30 MG/ML injection 30 mg (30 mg Intravenous Given 02/10/23 2152)    ED Course/ Medical Decision Making/ A&P                                 Medical Decision Making Amount and/or Complexity of Data Reviewed Labs: ordered. Radiology: ordered.  Risk Prescription drug management.   This patient is a 53 y.o. male  who presents to the ED for concern of L flank pain.   Differential diagnoses prior to evaluation: The emergent differential diagnosis includes, but is not limited to,  AAA, renal artery/vein embolism/thrombosis, mesenteric ischemia, pyelonephritis, nephrolithiasis, cystitis, biliary colic, pancreatitis, perforated peptic ulcer, appendicitis, diverticulitis, bowel obstruction, Testicular torsion, Epididymitis. This is not an exhaustive differential.   Past Medical History / Co-morbidities / Social History: multiple ankle surgeries, back surgery s/p spinal cord stimulator, HTN, depression, anemia (B12 and iron deficiency), diabetes  Additional history: Chart reviewed. Pertinent results include: reviewed dermatology note and labs from 1/7  Physical Exam: Physical exam performed. The pertinent findings include: Hypertensive, but otherwise normal vital signs. Reproducible tenderness to palpation over left flank, no CVA tenderness, no rash.  Lab Tests/Imaging studies: I personally interpreted labs/imaging and the pertinent results include:  CBC with stable hemoglobin compared to prior. CMP with mildly elevated creatinine compared to prior, GFR 50. UA normal.   CT renal stone study without acute abnormalities. I agree with the radiologist interpretation.  Medications: I ordered medication including toradol . Advised patient that one time dose of medication should not cause significant kidney injury but  advised him to continue hydration, and no ibuprofen  or similar agents at home.  I have reviewed the patients home medicines and have made adjustments as needed.   Disposition: After consideration of the diagnostic results and the patients response to treatment, I feel that emergency department workup does not suggest an emergent condition requiring admission or immediate intervention beyond what has been performed at this time. The plan is: discharge to home. No emergent etiology found for symptoms.  The patient is safe for discharge and has been instructed to return immediately for worsening symptoms, change in symptoms or any other concerns.  Final Clinical Impression(s) /  ED Diagnoses Final diagnoses:  Left flank pain    Rx / DC Orders ED Discharge Orders     None      Portions of this report may have been transcribed using voice recognition software. Every effort was made to ensure accuracy; however, inadvertent computerized transcription errors may be present.    Johnston Maddocks T, PA-Tucker 02/10/23 2313    Ula Prentice SAUNDERS, MD 02/11/23 1531

## 2023-02-10 NOTE — ED Triage Notes (Signed)
 Pt having left sided flank pain for the past 2 days. Was seen by PCP, labs showing slightly elevated GFR. Pt increased fluid intake. Pain unrelieved with tylenol; heating pad helpful

## 2023-02-10 NOTE — Discharge Instructions (Signed)
 You were seen in the ER for flank pain.  As we discussed, your kidney function is a tiny bit worse than the other day. Otherwise your anemia appears stable, and your other blood work looks reassuring. Your CT scan did not show any concerning findings such as kidney stone, dilation of your aorta, or anything else out of the ordinary.   I recommend continuing hydration at home. You can use lidocaine  patches and taking your prescribed muscle relaxer.   I strongly recommend following up with your primary care provider regarding your kidney function testing as you would benefit from further investigation into this.   Continue to monitor how you're doing and return to the ER for new or worsening symptoms.

## 2023-02-12 ENCOUNTER — Encounter: Payer: Self-pay | Admitting: Neurology

## 2023-02-12 ENCOUNTER — Telehealth (INDEPENDENT_AMBULATORY_CARE_PROVIDER_SITE_OTHER): Payer: Medicare Other | Admitting: Neurology

## 2023-02-12 DIAGNOSIS — G40009 Localization-related (focal) (partial) idiopathic epilepsy and epileptic syndromes with seizures of localized onset, not intractable, without status epilepticus: Secondary | ICD-10-CM | POA: Diagnosis not present

## 2023-02-12 MED ORDER — DIVALPROEX SODIUM 500 MG PO DR TAB: DELAYED_RELEASE_TABLET | ORAL | 3 refills | Status: AC
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
## 2023-04-21 IMAGING — DX DG LUMBAR SPINE COMPLETE 4+V
5 series · 5 of 5 positions shown · non-contrast
Comparison: None.

CLINICAL DATA: Low back pain

EXAM:
LUMBAR SPINE - COMPLETE 4+ VIEW

[l-spine ap]
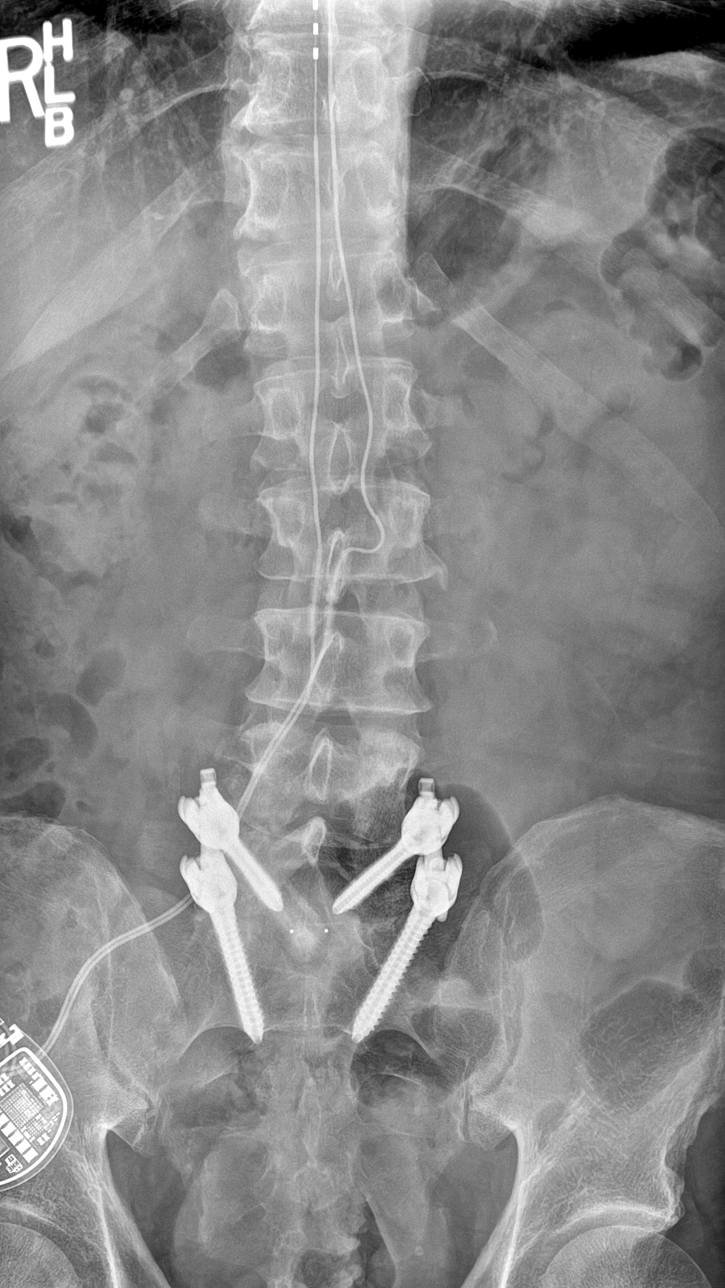

[l-spine obl (1 of 2)]
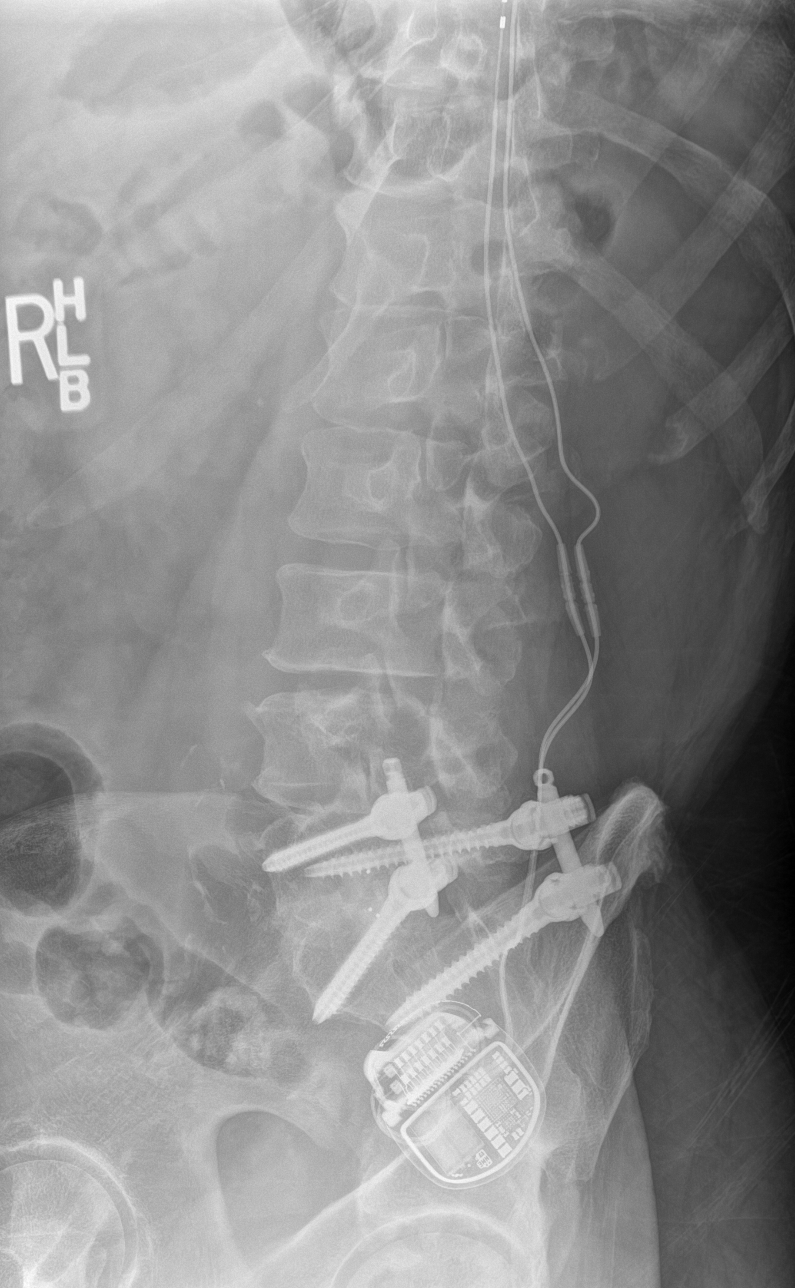

[l-spine obl (2 of 2)]
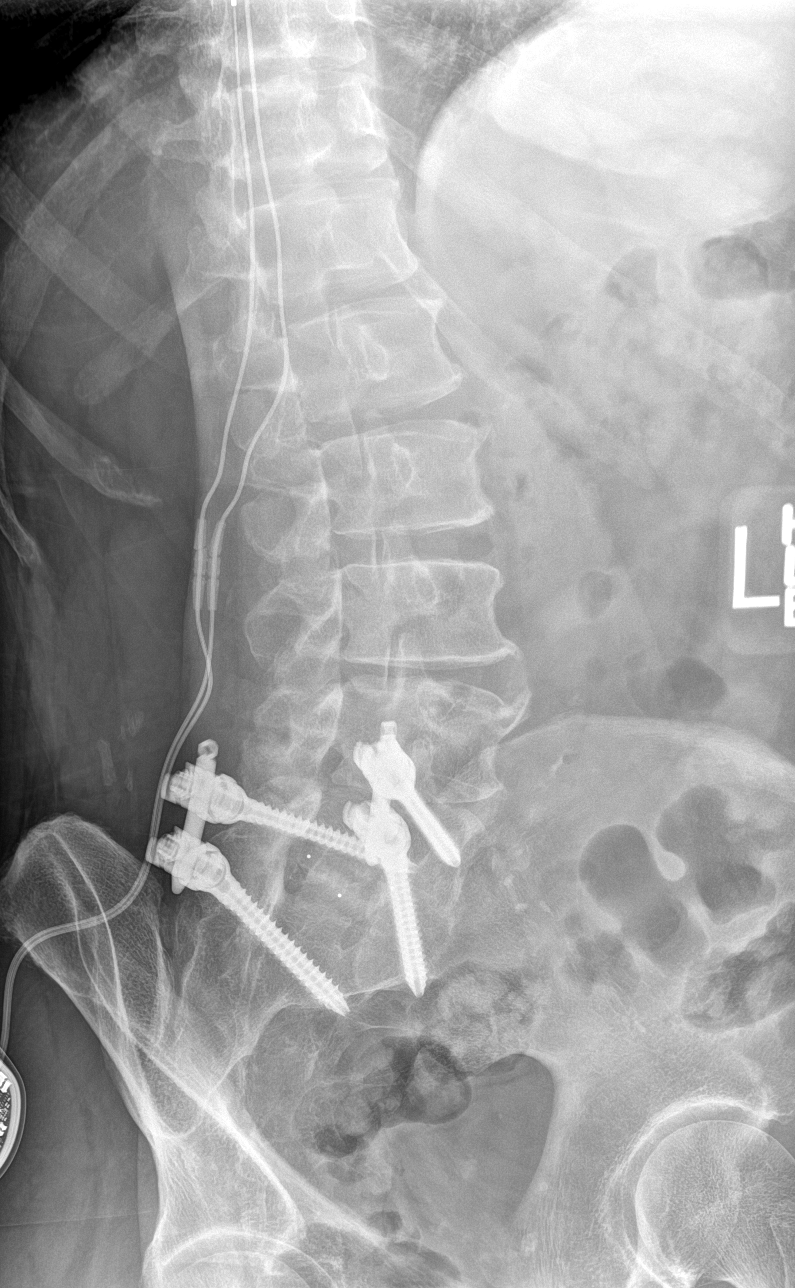

[l-spine lat]
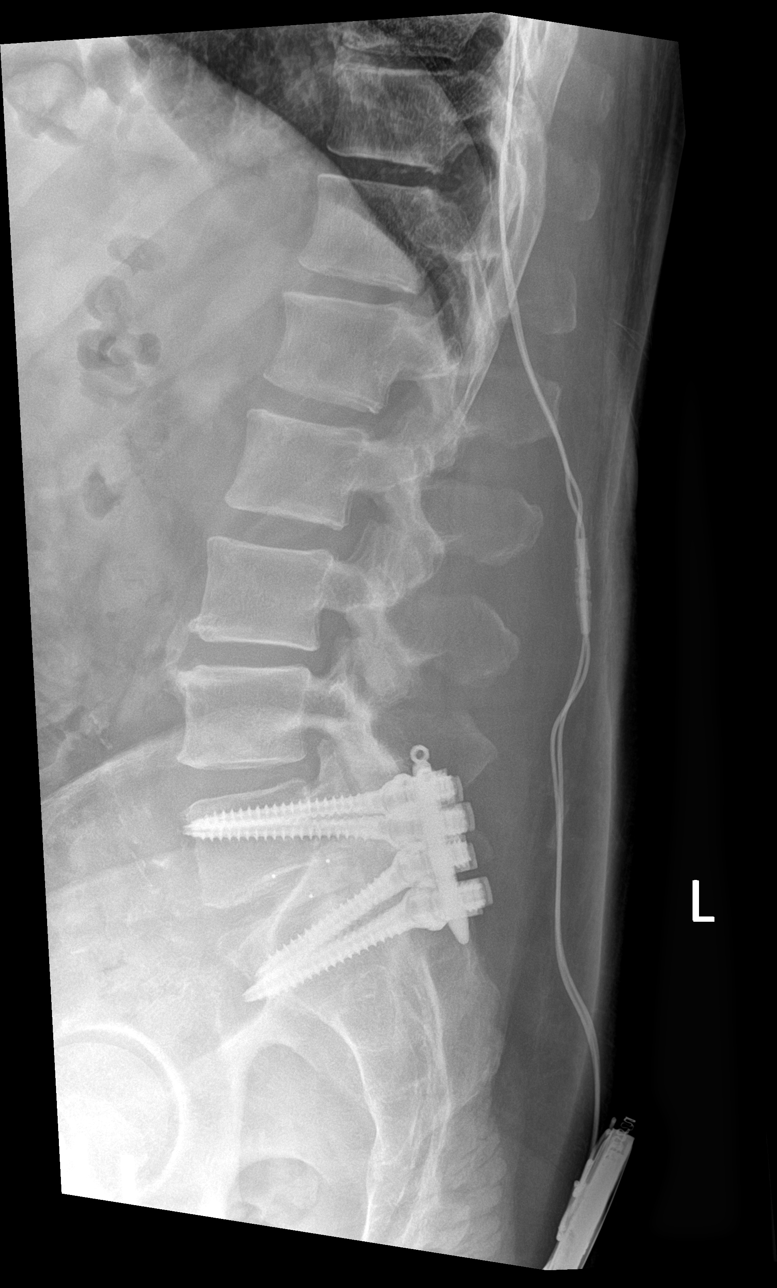

[l-spine spot]
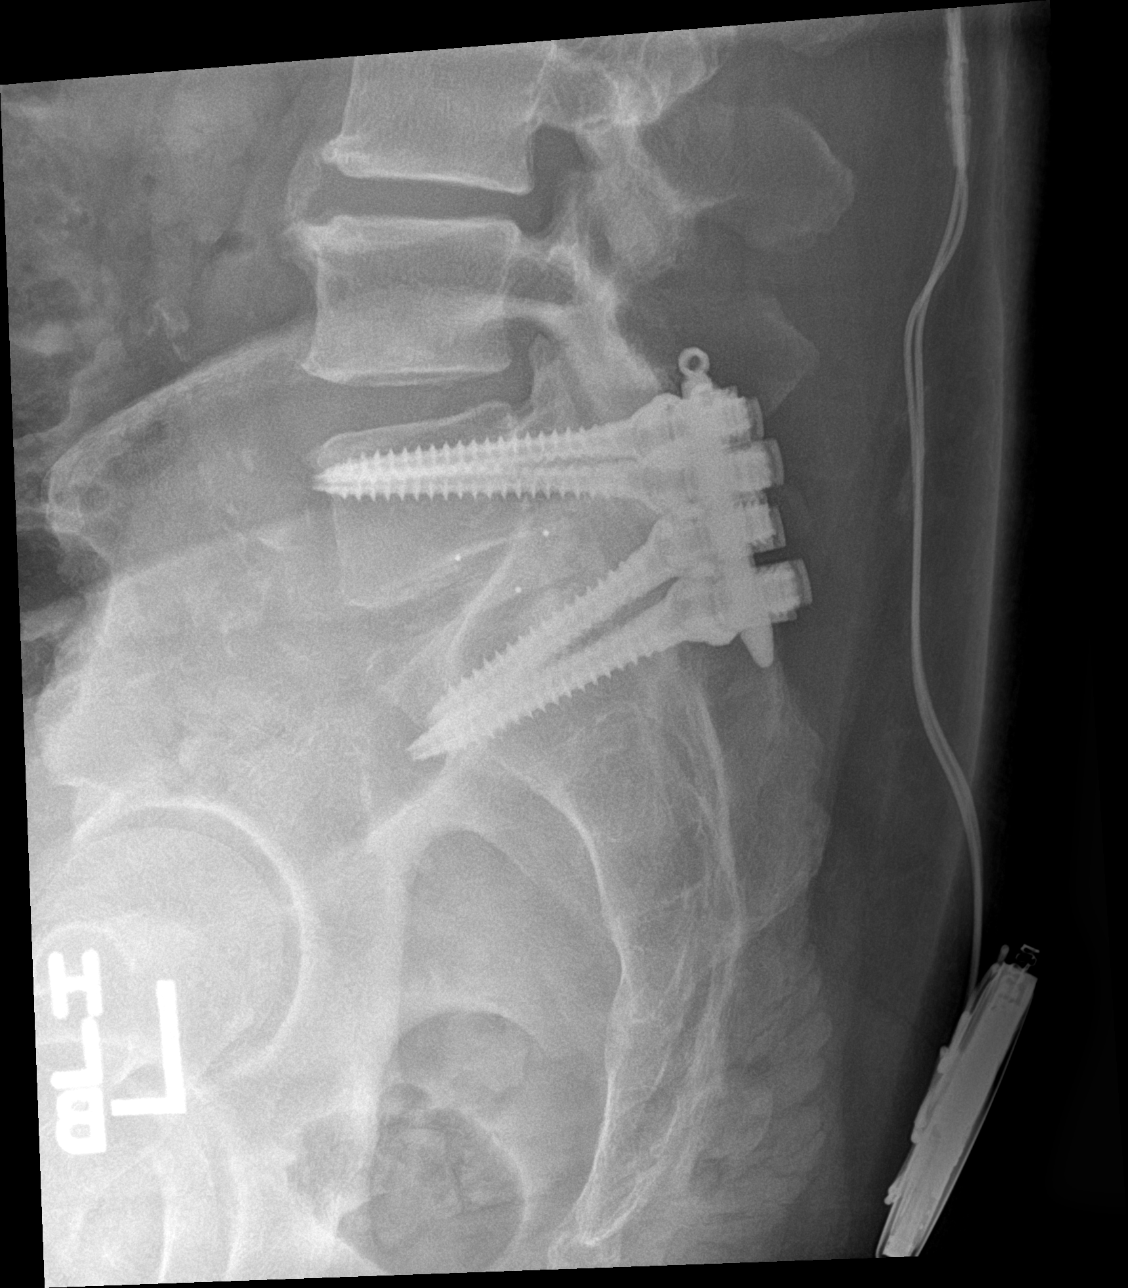

[5 of 5 positions shown; findings below may reference images not displayed]

FINDINGS: There is mild levoscoliosis. No recent fracture is seen.
Degenerative changes are noted with disc space narrowing, bony spurs
and facet hypertrophy. There is previous surgical fusion at L5-S1
level. Intervertebral disc spacer is noted at L5-S1 level. There are
scattered arterial calcifications. There is pain control device in
the right gluteal region with leads extending to the thoracic spinal
canal.
IMPRESSION: No recent fracture is seen. Degenerative changes and postsurgical
changes are noted.

## 2023-04-24 ENCOUNTER — Ambulatory Visit: Payer: 59 | Admitting: Dermatology

## 2023-05-07 ENCOUNTER — Other Ambulatory Visit (HOSPITAL_COMMUNITY): Payer: Self-pay | Admitting: Psychiatry

## 2023-05-07 DIAGNOSIS — F411 Generalized anxiety disorder: Secondary | ICD-10-CM

## 2023-05-07 DIAGNOSIS — F319 Bipolar disorder, unspecified: Secondary | ICD-10-CM

## 2023-05-09 ENCOUNTER — Encounter (HOSPITAL_COMMUNITY): Payer: Self-pay | Admitting: Psychiatry

## 2023-05-09 ENCOUNTER — Telehealth (HOSPITAL_COMMUNITY): Payer: 59 | Admitting: Psychiatry

## 2023-05-09 DIAGNOSIS — F410 Panic disorder [episodic paroxysmal anxiety] without agoraphobia: Secondary | ICD-10-CM | POA: Diagnosis not present

## 2023-05-09 DIAGNOSIS — F411 Generalized anxiety disorder: Secondary | ICD-10-CM | POA: Diagnosis not present

## 2023-05-09 DIAGNOSIS — F319 Bipolar disorder, unspecified: Secondary | ICD-10-CM

## 2023-05-09 MED ORDER — ESCITALOPRAM OXALATE 20 MG PO TABS: 20.0000 mg | ORAL_TABLET | Freq: Every day | ORAL | 2 refills | Status: DC

## 2023-05-09 MED ORDER — VENLAFAXINE HCL ER 75 MG PO CP24
75.0000 mg | ORAL_CAPSULE | ORAL | 2 refills | Status: DC
Start: 2023-05-09 — End: 2023-08-13

## 2023-05-09 MED ORDER — HYDROXYZINE PAMOATE 50 MG PO CAPS: 100.0000 mg | ORAL_CAPSULE | Freq: Every day | ORAL | 2 refills | Status: DC

## 2023-05-09 MED ORDER — QUETIAPINE FUMARATE 300 MG PO TABS: 300.0000 mg | ORAL_TABLET | Freq: Every day | ORAL | 2 refills | Status: DC
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
## 2023-05-10 ENCOUNTER — Other Ambulatory Visit (HOSPITAL_COMMUNITY): Payer: Self-pay | Admitting: Psychiatry

## 2023-05-10 DIAGNOSIS — F319 Bipolar disorder, unspecified: Secondary | ICD-10-CM

## 2023-06-07 ENCOUNTER — Encounter: Payer: Self-pay | Admitting: Dermatology

## 2023-06-07 ENCOUNTER — Ambulatory Visit: Payer: 59 | Admitting: Dermatology

## 2023-06-07 VITALS — BP 129/84

## 2023-06-07 DIAGNOSIS — D6489 Other specified anemias: Secondary | ICD-10-CM | POA: Diagnosis not present

## 2023-06-07 DIAGNOSIS — L639 Alopecia areata, unspecified: Secondary | ICD-10-CM | POA: Diagnosis not present

## 2023-06-07 DIAGNOSIS — L811 Chloasma: Secondary | ICD-10-CM

## 2023-06-07 DIAGNOSIS — D508 Other iron deficiency anemias: Secondary | ICD-10-CM

## 2023-06-07 MED ORDER — OLUMIANT 4 MG PO TABS: 1.0000 | ORAL_TABLET | Freq: Every day | ORAL | 5 refills | Status: AC

## 2023-06-07 NOTE — Progress Notes (Signed)
   Follow-Up Visit   Subjective  Manuel Tucker is a 53 y.o. male who presents for the following: Alopecia Areata  Patient present today for follow up visit. Patient was last evaluated on 02/06/23. At previous visit in October patient was prescribed Olumiant  . Patient reports sxs are better. Patient denies medication changes.  The following portions of the chart were reviewed this encounter and updated as appropriate: medications, allergies, medical history  Review of Systems:  No other skin or systemic complaints except as noted in HPI or Assessment and Plan.  Objective  Well appearing patient in no apparent distress; mood and affect are within normal limits.   A focused examination was performed of the following areas: face   Relevant exam findings are noted in the Assessment and Plan.          Assessment & Plan   1. Alopecia Arreata  - Assessment: Patient has been on Olumiant  for approximately 4 months for alopecia treatment. Reporting positive response with noticeable hair regrowth, which is consistent with expected outcomes from clinical trials. No side effects reported from the medication. - Plan:    Continue Olumiant  (baricitinib ) at current dose    Refill prescription sent    Follow-up appointment in 6 months    Patient instructed to send MyChart message if issues arise with prescription  2. Melasma - Assessment: Patient presents with melasma, likely sun-related. Currently using vitamin C serum and other topical treatments, but not consistently using sunscreen. - Plan:    Start Neutrogena Ultrasheer sunscreen daily, even with minimal sun exposure    Add Eucerin Advanced Repair dual active serum to current skincare regimen    Continue current vitamin C serum and other topical treatments    Recommend CeraVe invisible zinc sunscreen stick for additional protection, especially during outdoor activities    Educate on importance of consistent sunscreen use to prevent  melasma progression    Follow up in 6 months with facial photographs to track progress  3. Anemia - Assessment: Patient has a history of chronic, intermittent anemia. Recent lab results showed improvement in hemoglobin levels from 8 g/dL in January to 11 g/dL currently. Patient reports taking vitamin A, folic acid, and iron supplements, which were temporarily discontinued for recent back surgery but resumed 2 weeks ago. No current symptoms of anemia reported. Diet includes regular consumption of meat and fiber-rich foods  - Plan:    Continue current supplement regimen (vitamin A, folic acid, iron)    Monitor for symptoms of anemia (lightheadedness, dizziness, fatigue)    Ongoing surveillance of hemoglobin levels    No follow-ups on file.  Documentation: I have reviewed the above documentation for accuracy and completeness, and I agree with the above.  I, Shirron Louanne Roussel, CMA, am acting as scribe for [employer], DO.   Louana Roup, DO

## 2023-06-07 NOTE — Patient Instructions (Addendum)
 Dear Manuel Tucker,  Thank you for visiting today. Here is a summary of the key instructions:  - Medications:   - Continue taking Olumiant  as prescribed   - Refill for Olumiant  will be sent  - Skincare:   - Use Neutrogena Ultrashear sunscreen daily, even when driving   - Apply Excedrin Advanced Repair dual active serum for melasma   - Continue using vitamin C cream and pump oil   - Use CeraVe invisible zinc sunscreen stick for extra protection, especially when fishing or kayaking  - Follow-up:   - Next appointment in 6 months  - Lifestyle:   - Wear sunscreen every day, even with limited sun exposure   - Continue eating a diet with green vegetables and fiber  Please reach out if you have any questions or concerns.  Best Regards,  Dr. Louana Roup [facility]at[facility] Important Information  Due to recent changes in healthcare laws, you may see results of your pathology and/or laboratory studies on MyChart before the doctors have had a chance to review them. We understand that in some cases there may be results that are confusing or concerning to you. Please understand that not all results are received at the same time and often the doctors may need to interpret multiple results in order to provide you with the best plan of care or course of treatment. Therefore, we ask that you please give us  2 business days to thoroughly review all your results before contacting the office for clarification. Should we see a critical lab result, you will be contacted sooner.   If You Need Anything After Your Visit  If you have any questions or concerns for your doctor, please call our main line at [phone] If no one answers, please leave a voicemail as directed and we will return your call as soon as possible. Messages left after 4 pm will be answered the following business day.   You may also send us  a message via MyChart. We typically respond to MyChart messages within 1-2 business days.  For  prescription refills, please ask your pharmacy to contact our office. Our fax number is [phone].  If you have an urgent issue when the clinic is closed that cannot wait until the next business day, you can page your doctor at the number below.    Please note that while we do our best to be available for urgent issues outside of office hours, we are not available 24/7.   If you have an urgent issue and are unable to reach us , you may choose to seek medical care at your doctor's office, retail clinic, urgent care center, or emergency room.  If you have a medical emergency, please immediately call 911 or go to the emergency department. In the event of inclement weather, please call our main line at [phone] for an update on the status of any delays or closures.  Dermatology Medication Tips: Please keep the boxes that topical medications come in in order to help keep track of the instructions about where and how to use these. Pharmacies typically print the medication instructions only on the boxes and not directly on the medication tubes.   If your medication is too expensive, please contact our office at [phone] or send us  a message through MyChart.   We are unable to tell what your co-pay for medications will be in advance as this is different depending on your insurance coverage. However, we may be able to find a substitute medication at lower  cost or fill out paperwork to get insurance to cover a needed medication.   If a prior authorization is required to get your medication covered by your insurance company, please allow us  1-2 business days to complete this process.  Drug prices often vary depending on where the prescription is filled and some pharmacies may offer cheaper prices.  The website www.goodrx.com contains coupons for medications through different pharmacies. The prices here do not account for what the cost may be with help from insurance (it may be cheaper with your  insurance), but the website can give you the price if you did not use any insurance.  - You can print the associated coupon and take it with your prescription to the pharmacy.  - You may also stop by our office during regular business hours and pick up a GoodRx coupon card.  - If you need your prescription sent electronically to a different pharmacy, notify our office through [facility] or by phone at [phone]

## 2023-06-20 ENCOUNTER — Telehealth: Payer: Self-pay

## 2023-06-20 NOTE — Telephone Encounter (Signed)
 [facility] to schedule next delivery date for Olumiant .

## 2023-07-17 ENCOUNTER — Other Ambulatory Visit (HOSPITAL_COMMUNITY): Payer: Self-pay | Admitting: Psychiatry

## 2023-07-17 DIAGNOSIS — F319 Bipolar disorder, unspecified: Secondary | ICD-10-CM

## 2023-07-17 DIAGNOSIS — F411 Generalized anxiety disorder: Secondary | ICD-10-CM

## 2023-08-12 ENCOUNTER — Other Ambulatory Visit (HOSPITAL_COMMUNITY): Payer: Self-pay | Admitting: Psychiatry

## 2023-08-12 DIAGNOSIS — F411 Generalized anxiety disorder: Secondary | ICD-10-CM

## 2023-08-12 DIAGNOSIS — F319 Bipolar disorder, unspecified: Secondary | ICD-10-CM

## 2023-08-13 ENCOUNTER — Encounter (HOSPITAL_COMMUNITY): Payer: Self-pay

## 2023-08-13 ENCOUNTER — Other Ambulatory Visit (HOSPITAL_COMMUNITY): Payer: Self-pay

## 2023-08-13 DIAGNOSIS — F410 Panic disorder [episodic paroxysmal anxiety] without agoraphobia: Secondary | ICD-10-CM

## 2023-08-13 DIAGNOSIS — F411 Generalized anxiety disorder: Secondary | ICD-10-CM

## 2023-08-13 DIAGNOSIS — F319 Bipolar disorder, unspecified: Secondary | ICD-10-CM

## 2023-08-13 MED ORDER — ESCITALOPRAM OXALATE 20 MG PO TABS: 20.0000 mg | ORAL_TABLET | Freq: Every day | ORAL | 0 refills | Status: DC

## 2023-08-13 MED ORDER — HYDROXYZINE PAMOATE 50 MG PO CAPS: 100.0000 mg | ORAL_CAPSULE | Freq: Every day | ORAL | 0 refills | Status: DC

## 2023-08-13 MED ORDER — QUETIAPINE FUMARATE 300 MG PO TABS: 300.0000 mg | ORAL_TABLET | Freq: Every day | ORAL | 0 refills | Status: DC

## 2023-08-13 MED ORDER — VENLAFAXINE HCL ER 75 MG PO CP24: 75.0000 mg | ORAL_CAPSULE | ORAL | 0 refills | Status: DC

## 2023-08-31 ENCOUNTER — Telehealth (HOSPITAL_COMMUNITY): Admitting: Psychiatry

## 2023-08-31 ENCOUNTER — Encounter (HOSPITAL_COMMUNITY): Payer: Self-pay | Admitting: Psychiatry

## 2023-08-31 DIAGNOSIS — F410 Panic disorder [episodic paroxysmal anxiety] without agoraphobia: Secondary | ICD-10-CM

## 2023-08-31 DIAGNOSIS — F411 Generalized anxiety disorder: Secondary | ICD-10-CM

## 2023-08-31 DIAGNOSIS — F319 Bipolar disorder, unspecified: Secondary | ICD-10-CM | POA: Diagnosis not present

## 2023-08-31 MED ORDER — QUETIAPINE FUMARATE 300 MG PO TABS: 300.0000 mg | ORAL_TABLET | Freq: Every day | ORAL | 2 refills | Status: DC

## 2023-08-31 MED ORDER — VENLAFAXINE HCL ER 75 MG PO CP24: 75.0000 mg | ORAL_CAPSULE | ORAL | 2 refills | Status: DC

## 2023-08-31 MED ORDER — ESCITALOPRAM OXALATE 20 MG PO TABS: 20.0000 mg | ORAL_TABLET | Freq: Every day | ORAL | 2 refills | Status: DC

## 2023-08-31 MED ORDER — HYDROXYZINE PAMOATE 50 MG PO CAPS: 100.0000 mg | ORAL_CAPSULE | Freq: Every day | ORAL | 2 refills | Status: DC
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
## 2023-12-04 ENCOUNTER — Telehealth (HOSPITAL_COMMUNITY): Admitting: Psychiatry

## 2023-12-04 ENCOUNTER — Telehealth (HOSPITAL_BASED_OUTPATIENT_CLINIC_OR_DEPARTMENT_OTHER): Admitting: Psychiatry

## 2023-12-04 ENCOUNTER — Encounter (HOSPITAL_COMMUNITY): Payer: Self-pay | Admitting: Psychiatry

## 2023-12-04 VITALS — Wt 174.0 lb

## 2023-12-04 DIAGNOSIS — F411 Generalized anxiety disorder: Secondary | ICD-10-CM | POA: Diagnosis not present

## 2023-12-04 DIAGNOSIS — F410 Panic disorder [episodic paroxysmal anxiety] without agoraphobia: Secondary | ICD-10-CM | POA: Diagnosis not present

## 2023-12-04 DIAGNOSIS — F319 Bipolar disorder, unspecified: Secondary | ICD-10-CM | POA: Diagnosis not present

## 2023-12-04 MED ORDER — HYDROXYZINE PAMOATE 50 MG PO CAPS: 100.0000 mg | ORAL_CAPSULE | Freq: Every day | ORAL | 2 refills | Status: DC

## 2023-12-04 MED ORDER — VENLAFAXINE HCL ER 75 MG PO CP24: 75.0000 mg | ORAL_CAPSULE | ORAL | 2 refills | Status: DC

## 2023-12-04 MED ORDER — QUETIAPINE FUMARATE 300 MG PO TABS: 300.0000 mg | ORAL_TABLET | Freq: Every day | ORAL | 2 refills | Status: DC

## 2023-12-04 MED ORDER — ESCITALOPRAM OXALATE 20 MG PO TABS: 20.0000 mg | ORAL_TABLET | Freq: Every day | ORAL | 2 refills | Status: DC
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
## 2023-12-05 ENCOUNTER — Telehealth (HOSPITAL_COMMUNITY): Admitting: Psychiatry

## 2023-12-07 ENCOUNTER — Telehealth (HOSPITAL_COMMUNITY): Admitting: Psychiatry

## 2023-12-10 ENCOUNTER — Encounter: Payer: Self-pay | Admitting: Neurology

## 2023-12-11 ENCOUNTER — Ambulatory Visit: Admitting: Dermatology

## 2024-02-05 ENCOUNTER — Ambulatory Visit: Payer: Medicare Other | Admitting: Neurology

## 2024-02-09 ENCOUNTER — Other Ambulatory Visit (HOSPITAL_COMMUNITY): Payer: Self-pay | Admitting: Psychiatry

## 2024-02-09 DIAGNOSIS — F319 Bipolar disorder, unspecified: Secondary | ICD-10-CM

## 2024-02-13 ENCOUNTER — Encounter (HOSPITAL_COMMUNITY): Payer: Self-pay

## 2024-03-03 ENCOUNTER — Encounter (HOSPITAL_COMMUNITY): Payer: Self-pay | Admitting: Psychiatry

## 2024-03-03 ENCOUNTER — Telehealth (HOSPITAL_COMMUNITY): Admitting: Psychiatry

## 2024-03-03 VITALS — Wt 168.0 lb

## 2024-03-03 DIAGNOSIS — F411 Generalized anxiety disorder: Secondary | ICD-10-CM | POA: Diagnosis not present

## 2024-03-03 DIAGNOSIS — F410 Panic disorder [episodic paroxysmal anxiety] without agoraphobia: Secondary | ICD-10-CM | POA: Diagnosis not present

## 2024-03-03 DIAGNOSIS — F319 Bipolar disorder, unspecified: Secondary | ICD-10-CM

## 2024-03-03 MED ORDER — VENLAFAXINE HCL ER 75 MG PO CP24: 75.0000 mg | ORAL_CAPSULE | ORAL | 2 refills | Status: AC

## 2024-03-03 MED ORDER — QUETIAPINE FUMARATE 300 MG PO TABS: 300.0000 mg | ORAL_TABLET | Freq: Every day | ORAL | 2 refills | Status: AC

## 2024-03-03 MED ORDER — ESCITALOPRAM OXALATE 20 MG PO TABS: 20.0000 mg | ORAL_TABLET | Freq: Every day | ORAL | 2 refills | Status: AC

## 2024-03-03 MED ORDER — HYDROXYZINE PAMOATE 50 MG PO CAPS: 100.0000 mg | ORAL_CAPSULE | Freq: Every day | ORAL | 2 refills | Status: AC
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
## 2024-03-04 ENCOUNTER — Telehealth (HOSPITAL_COMMUNITY): Admitting: Psychiatry

## 2024-06-02 ENCOUNTER — Telehealth (HOSPITAL_COMMUNITY): Admitting: Psychiatry

## 2024-08-13 ENCOUNTER — Ambulatory Visit: Admitting: Neurology
